# Patient Record
Sex: Female | Born: 1945
Health system: Southern US, Community
[De-identification: ages and names within clinical notes are randomized; demographics above are authoritative.]

## PROBLEM LIST (undated history)

## (undated) DIAGNOSIS — G43909 Migraine, unspecified, not intractable, without status migrainosus: Secondary | ICD-10-CM

## (undated) DIAGNOSIS — D219 Benign neoplasm of connective and other soft tissue, unspecified: Secondary | ICD-10-CM

## (undated) DIAGNOSIS — N952 Postmenopausal atrophic vaginitis: Secondary | ICD-10-CM

## (undated) DIAGNOSIS — I10 Essential (primary) hypertension: Secondary | ICD-10-CM

## (undated) DIAGNOSIS — N200 Calculus of kidney: Secondary | ICD-10-CM

## (undated) DIAGNOSIS — E7800 Pure hypercholesterolemia, unspecified: Secondary | ICD-10-CM

## (undated) DIAGNOSIS — I341 Nonrheumatic mitral (valve) prolapse: Secondary | ICD-10-CM

## (undated) DIAGNOSIS — M85859 Other specified disorders of bone density and structure, unspecified thigh: Secondary | ICD-10-CM

## (undated) DIAGNOSIS — IMO0002 Reserved for concepts with insufficient information to code with codable children: Secondary | ICD-10-CM

## (undated) DIAGNOSIS — R319 Hematuria, unspecified: Secondary | ICD-10-CM

## (undated) HISTORY — DX: Pure hypercholesterolemia, unspecified: E78.00

## (undated) HISTORY — DX: Other specified disorders of bone density and structure, unspecified thigh: M85.859

## (undated) HISTORY — DX: Hematuria, unspecified: R31.9

## (undated) HISTORY — DX: Migraine, unspecified, not intractable, without status migrainosus: G43.909

## (undated) HISTORY — DX: Benign neoplasm of connective and other soft tissue, unspecified: D21.9

## (undated) HISTORY — DX: Nonrheumatic mitral (valve) prolapse: I34.1

## (undated) HISTORY — DX: Essential (primary) hypertension: I10

## (undated) HISTORY — PX: HEMORRHOID BANDING: SHX5850

## (undated) HISTORY — DX: Calculus of kidney: N20.0

## (undated) HISTORY — DX: Postmenopausal atrophic vaginitis: N95.2

## (undated) HISTORY — DX: Reserved for concepts with insufficient information to code with codable children: IMO0002

## (undated) HISTORY — PX: DILATION AND CURETTAGE OF UTERUS: SHX78

---

## 1983-12-22 HISTORY — PX: TEMPOROMANDIBULAR JOINT SURGERY: SHX35

## 1984-04-20 HISTORY — PX: ABDOMINAL HYSTERECTOMY: SHX81

## 2001-02-18 ENCOUNTER — Other Ambulatory Visit: Admission: RE | Admit: 2001-02-18 | Discharge: 2001-02-18 | Payer: Self-pay | Admitting: *Deleted

## 2004-11-19 ENCOUNTER — Encounter: Admission: RE | Admit: 2004-11-19 | Discharge: 2004-11-19 | Payer: Self-pay | Admitting: Family Medicine

## 2005-11-04 ENCOUNTER — Encounter: Admission: RE | Admit: 2005-11-04 | Discharge: 2005-11-04 | Payer: Self-pay | Admitting: Family Medicine

## 2005-11-05 ENCOUNTER — Encounter: Admission: RE | Admit: 2005-11-05 | Discharge: 2005-11-05 | Payer: Self-pay | Admitting: Family Medicine

## 2005-12-23 ENCOUNTER — Ambulatory Visit (HOSPITAL_BASED_OUTPATIENT_CLINIC_OR_DEPARTMENT_OTHER): Admission: RE | Admit: 2005-12-23 | Discharge: 2005-12-23 | Payer: Self-pay | Admitting: Urology

## 2006-03-19 ENCOUNTER — Encounter: Admission: RE | Admit: 2006-03-19 | Discharge: 2006-03-19 | Payer: Self-pay | Admitting: Family Medicine

## 2007-03-21 ENCOUNTER — Encounter: Admission: RE | Admit: 2007-03-21 | Discharge: 2007-03-21 | Payer: Self-pay | Admitting: Family Medicine

## 2008-03-21 ENCOUNTER — Encounter: Admission: RE | Admit: 2008-03-21 | Discharge: 2008-03-21 | Payer: Self-pay | Admitting: Obstetrics and Gynecology

## 2008-10-03 ENCOUNTER — Encounter: Admission: RE | Admit: 2008-10-03 | Discharge: 2008-10-03 | Payer: Self-pay | Admitting: Family Medicine

## 2008-12-21 HISTORY — PX: ANKLE SURGERY: SHX546

## 2009-03-25 ENCOUNTER — Encounter: Admission: RE | Admit: 2009-03-25 | Discharge: 2009-03-25 | Payer: Self-pay | Admitting: Obstetrics and Gynecology

## 2009-04-23 ENCOUNTER — Ambulatory Visit: Payer: Self-pay | Admitting: Genetic Counselor

## 2009-07-11 ENCOUNTER — Encounter: Admission: RE | Admit: 2009-07-11 | Discharge: 2009-07-11 | Payer: Self-pay | Admitting: Family Medicine

## 2009-12-02 ENCOUNTER — Inpatient Hospital Stay (HOSPITAL_COMMUNITY): Admission: EM | Admit: 2009-12-02 | Discharge: 2009-12-04 | Payer: Self-pay | Admitting: Emergency Medicine

## 2010-03-27 ENCOUNTER — Encounter: Admission: RE | Admit: 2010-03-27 | Discharge: 2010-03-27 | Payer: Self-pay | Admitting: Family Medicine

## 2010-05-09 ENCOUNTER — Encounter: Admission: RE | Admit: 2010-05-09 | Discharge: 2010-05-09 | Payer: Self-pay | Admitting: Obstetrics and Gynecology

## 2010-12-12 IMAGING — CR DG ANKLE PORT 2V*L*
2 series · 2 of 2 positions shown · non-contrast
Comparison: Earlier films, same date.

CLINICAL DATA: Fracture dislocation.  Status post reduction#2.

PORTABLE LEFT ANKLE - 2 VIEW

[view not recorded (1 of 2)]
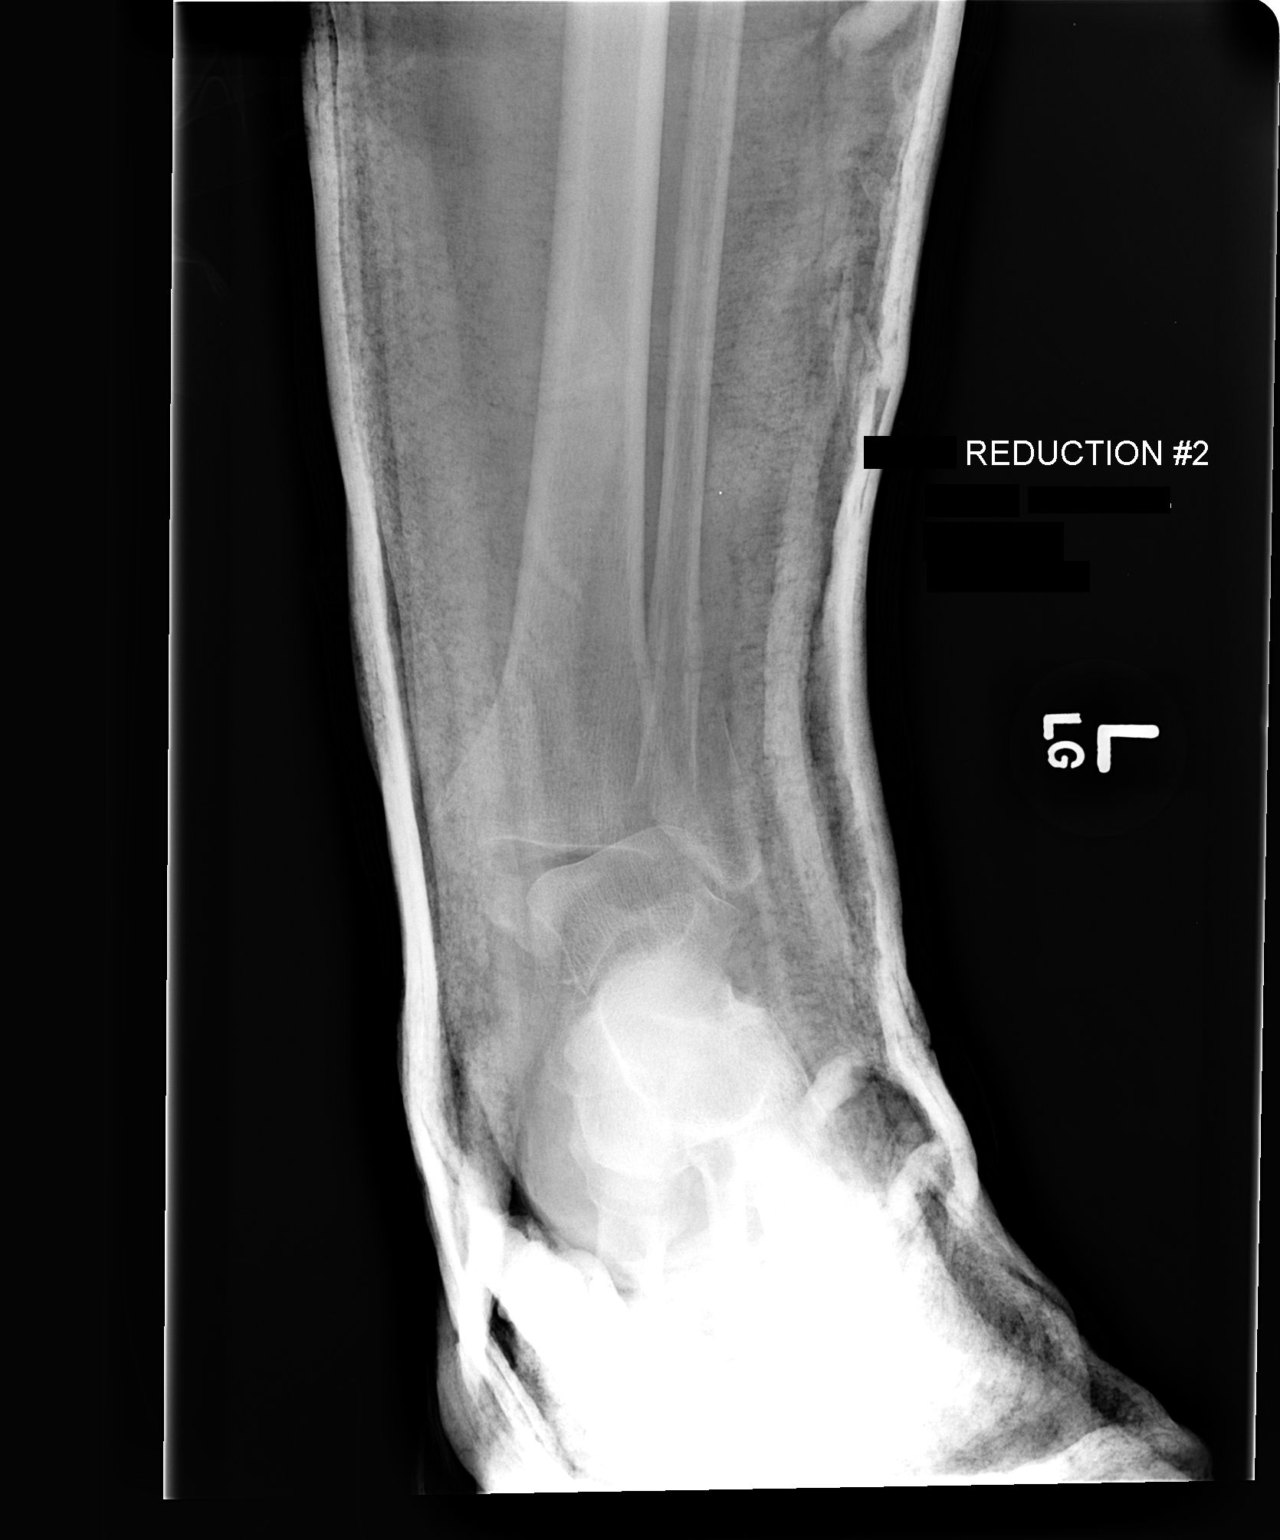

[view not recorded (2 of 2)]
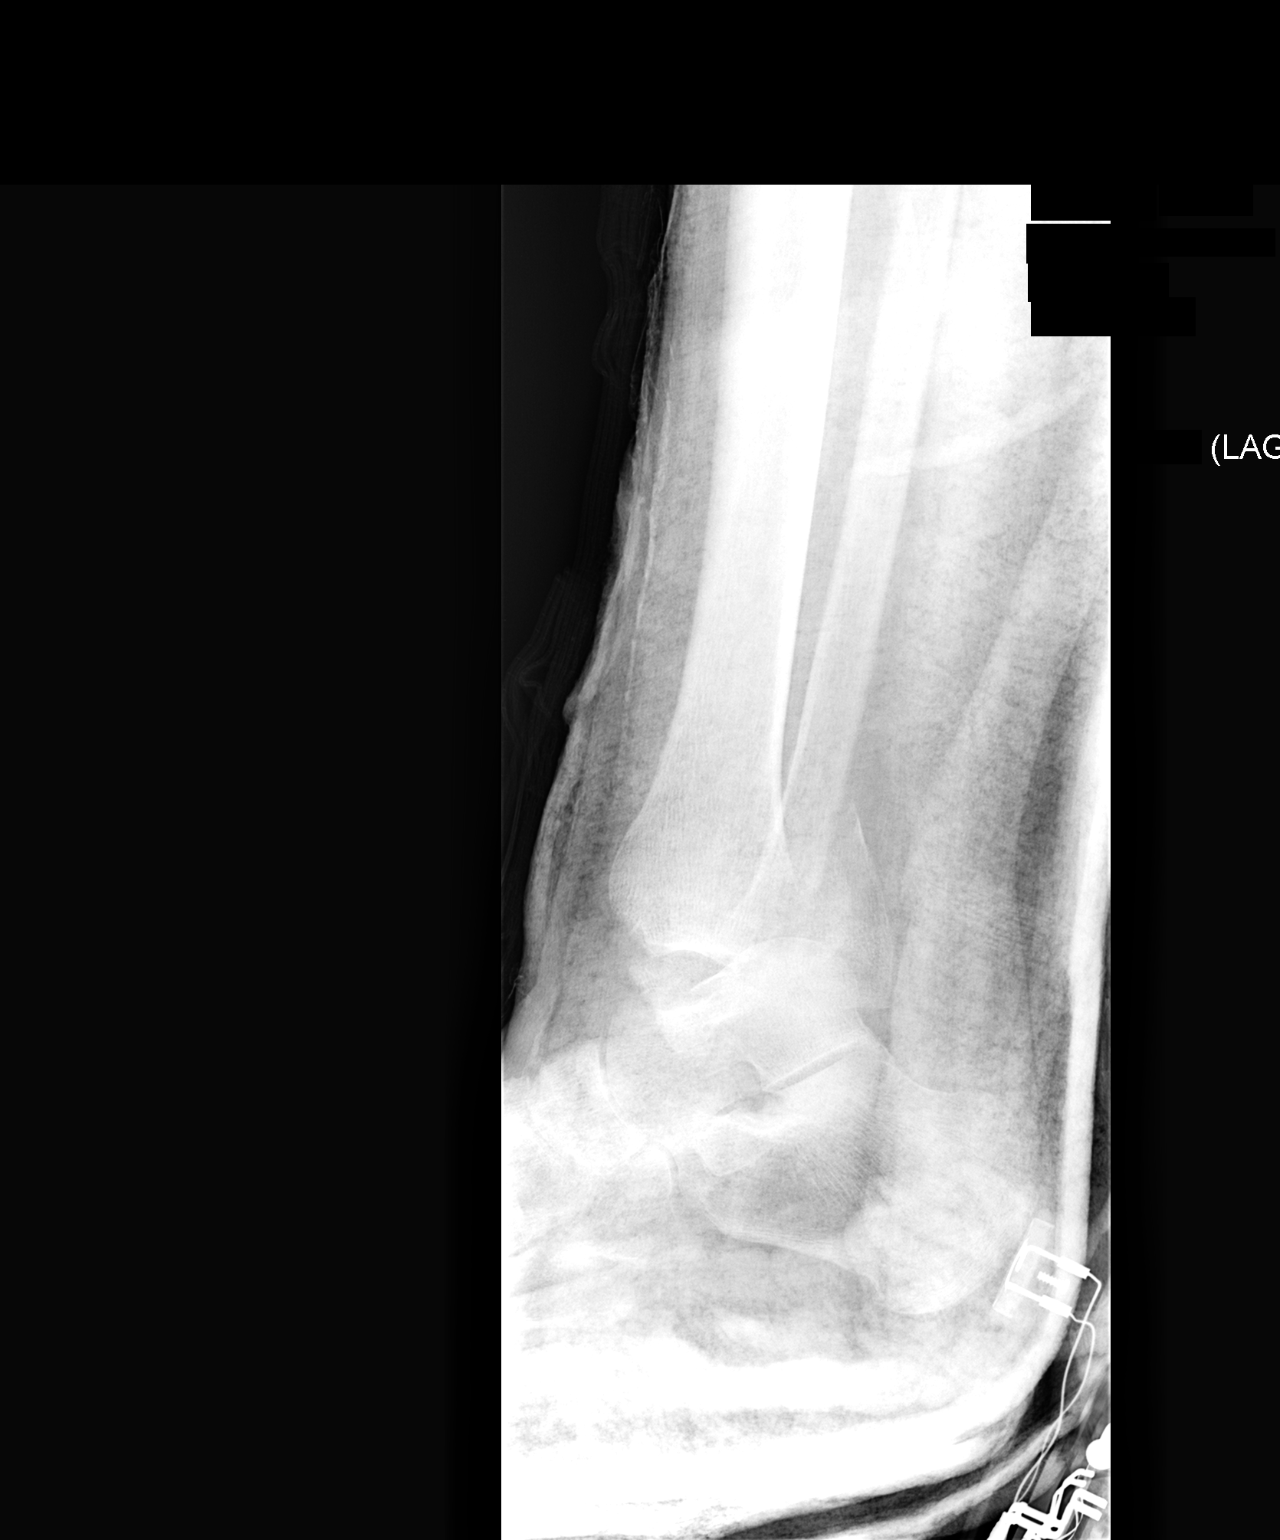

[2 of 2 positions shown; findings below may reference images not displayed]

FINDINGS: Persistent fracture dislocation of the ankle.  No change
in position or alignment.
IMPRESSION: Persistent fracture/dislocation.

## 2010-12-12 IMAGING — RF DG ANKLE COMPLETE 3+V*L*
1 series · 5 of 5 positions shown · non-contrast
Comparison: 12/02/2009

CLINICAL DATA: Ankle fracture - dislocation.  ORIF.

LEFT ANKLE COMPLETE - 3+ VIEW

[Series 1: run · 5 of 5 slices shown]
[im 1/5]
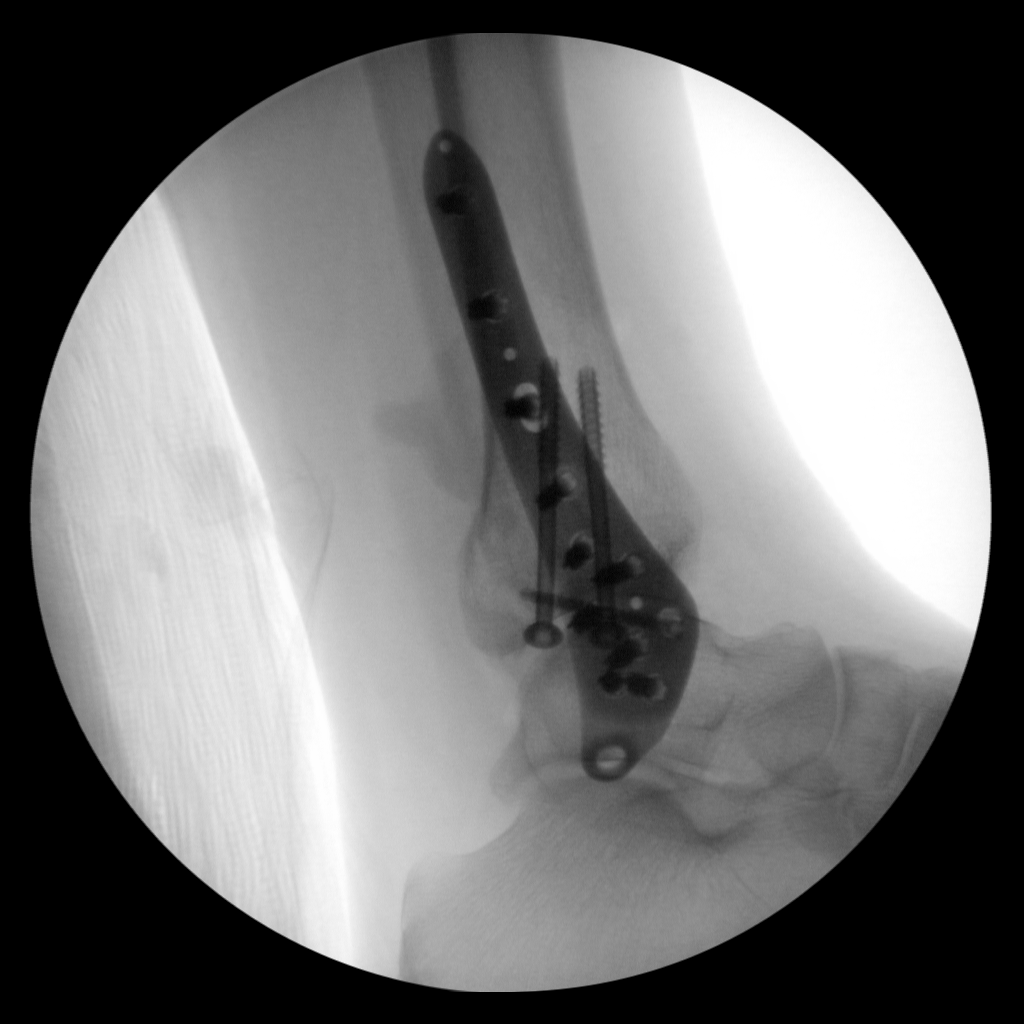
[im 2/5]
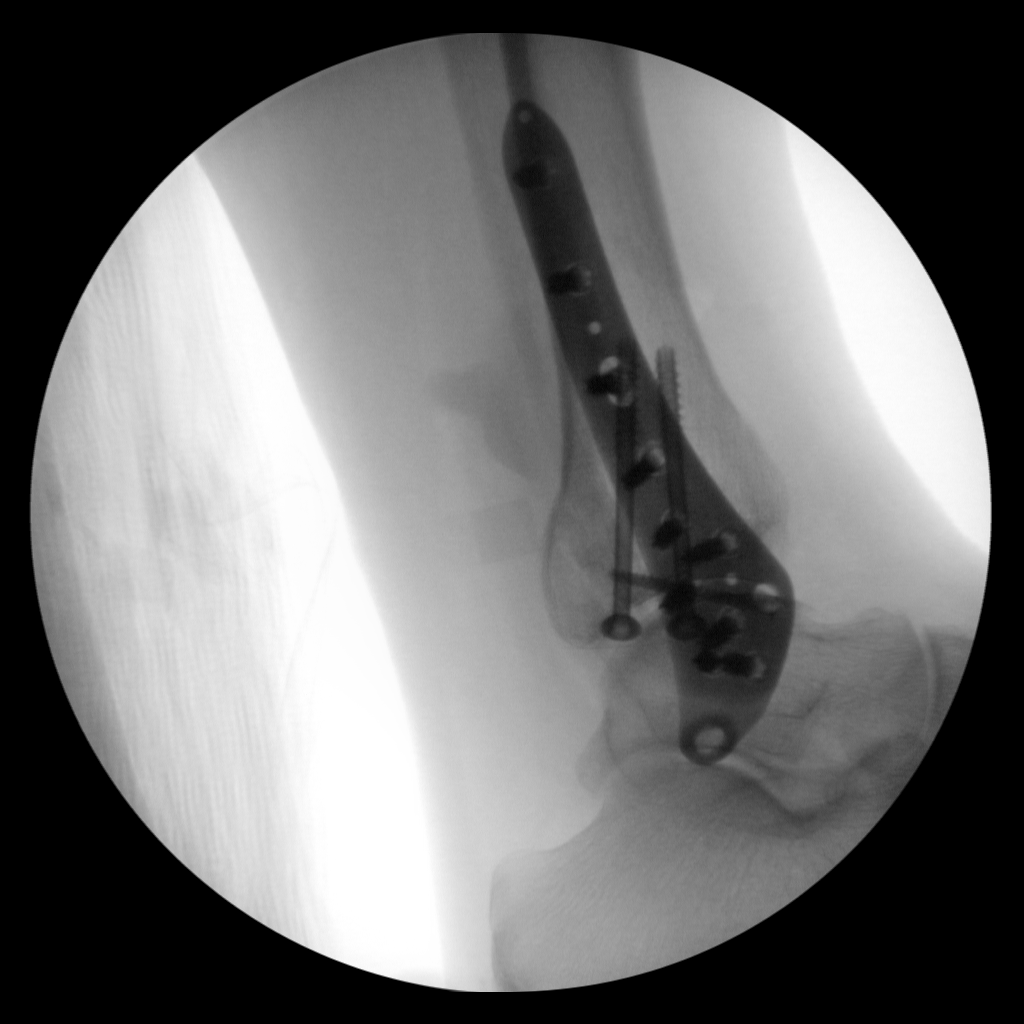
[im 3/5]
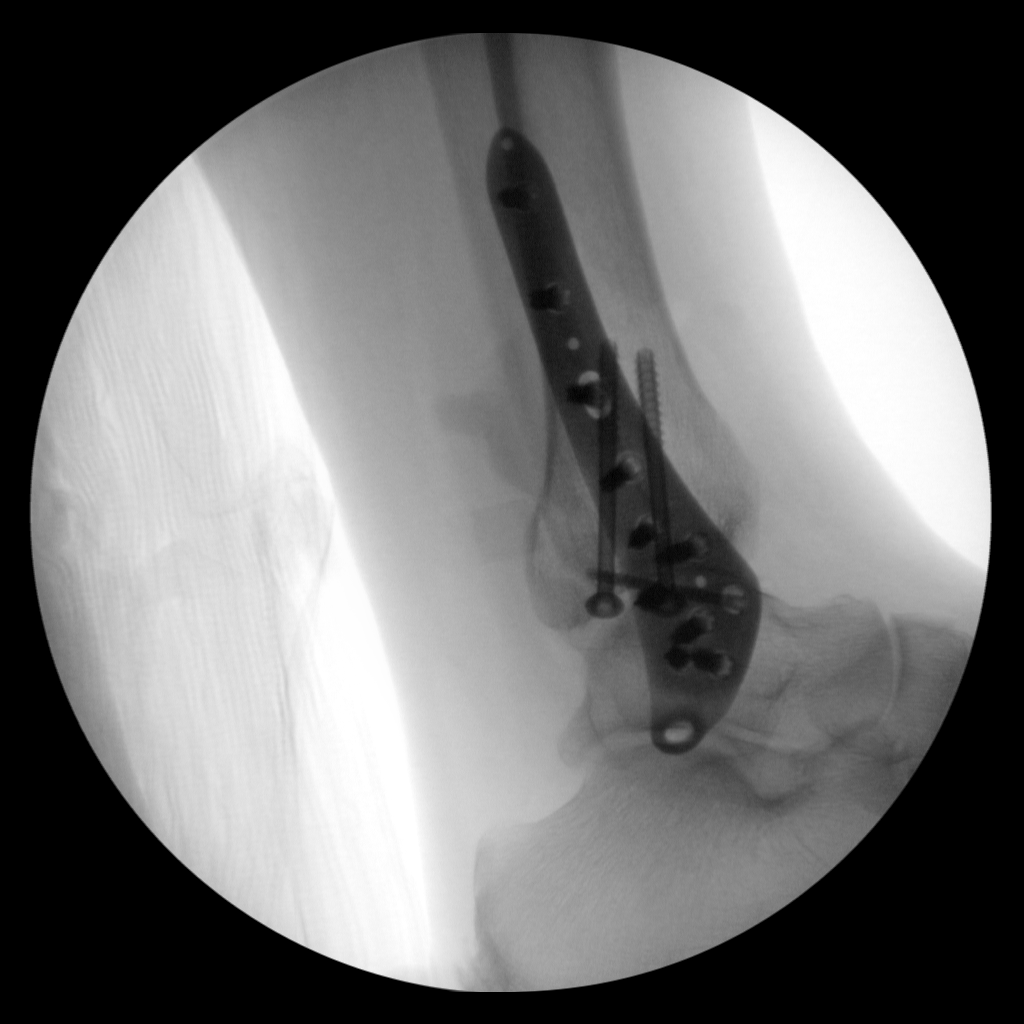
[im 4/5]
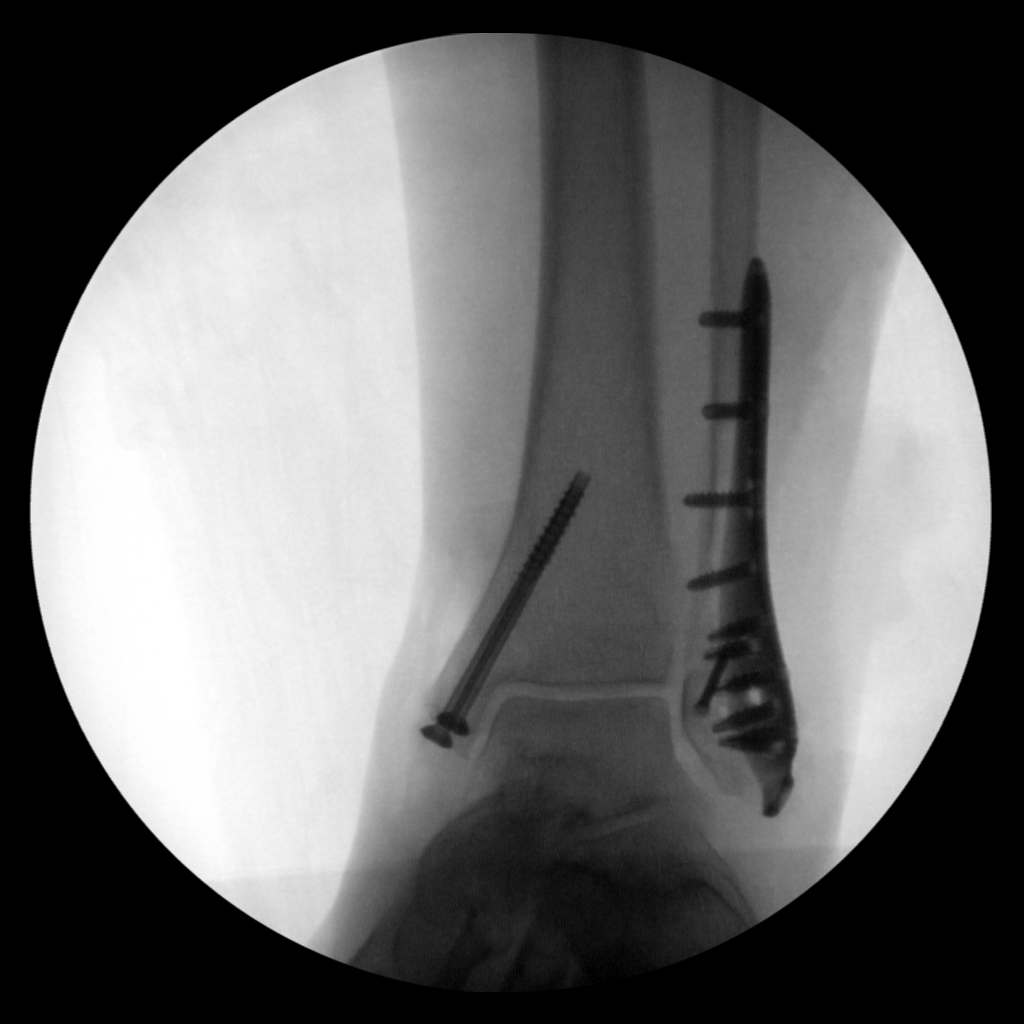
[im 5/5]
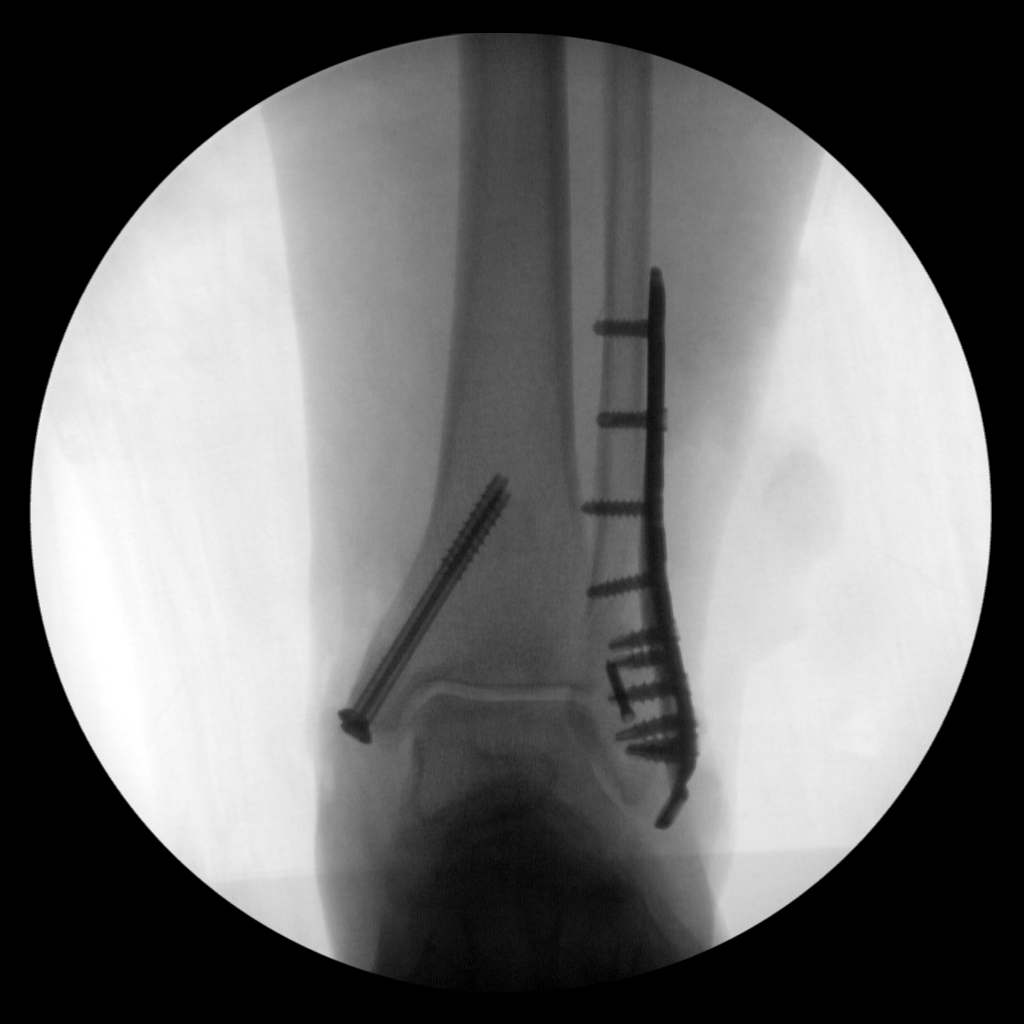

[5 of 5 positions shown; findings below may reference images not displayed]

FINDINGS: Left ankle intraoperative spot films are submitted
postoperatively for interpretation.

Internal plate screw fixation of the distal fibula and nail
fixation of the medial malleolus identified with the fracture sites
in near anatomic alignment and position.
There has been interval relocation of the tibiotalar joint.
IMPRESSION: Internal fixation of trimalleolar fractures, in near anatomic
alignment and position.

Interval relocation of ankle joint.

## 2010-12-12 IMAGING — CR DG ANKLE PORT 2V*L*
2 series · 2 of 2 positions shown · non-contrast
Comparison: Multiple prior studies.

CLINICAL DATA: Fracture dislocation. Postreduction #3.

PORTABLE LEFT ANKLE - 2 VIEW

[view not recorded (1 of 2)]
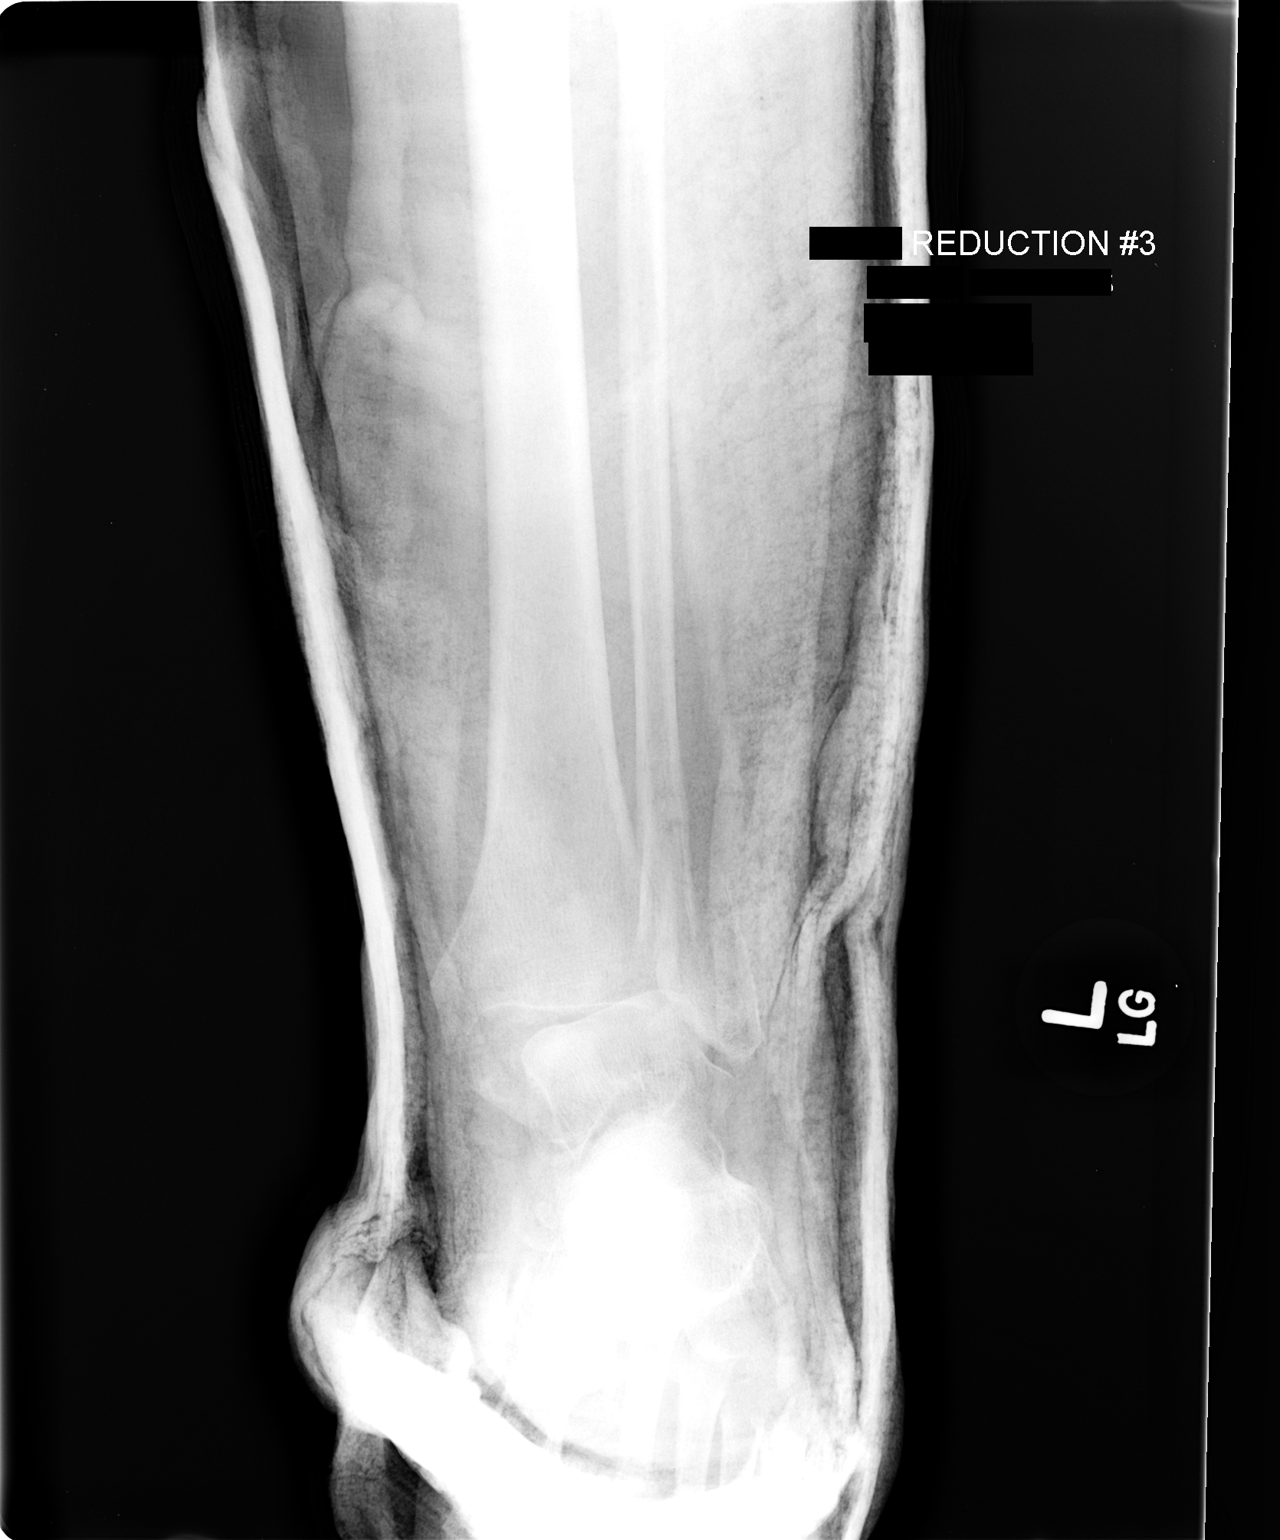

[view not recorded (2 of 2)]
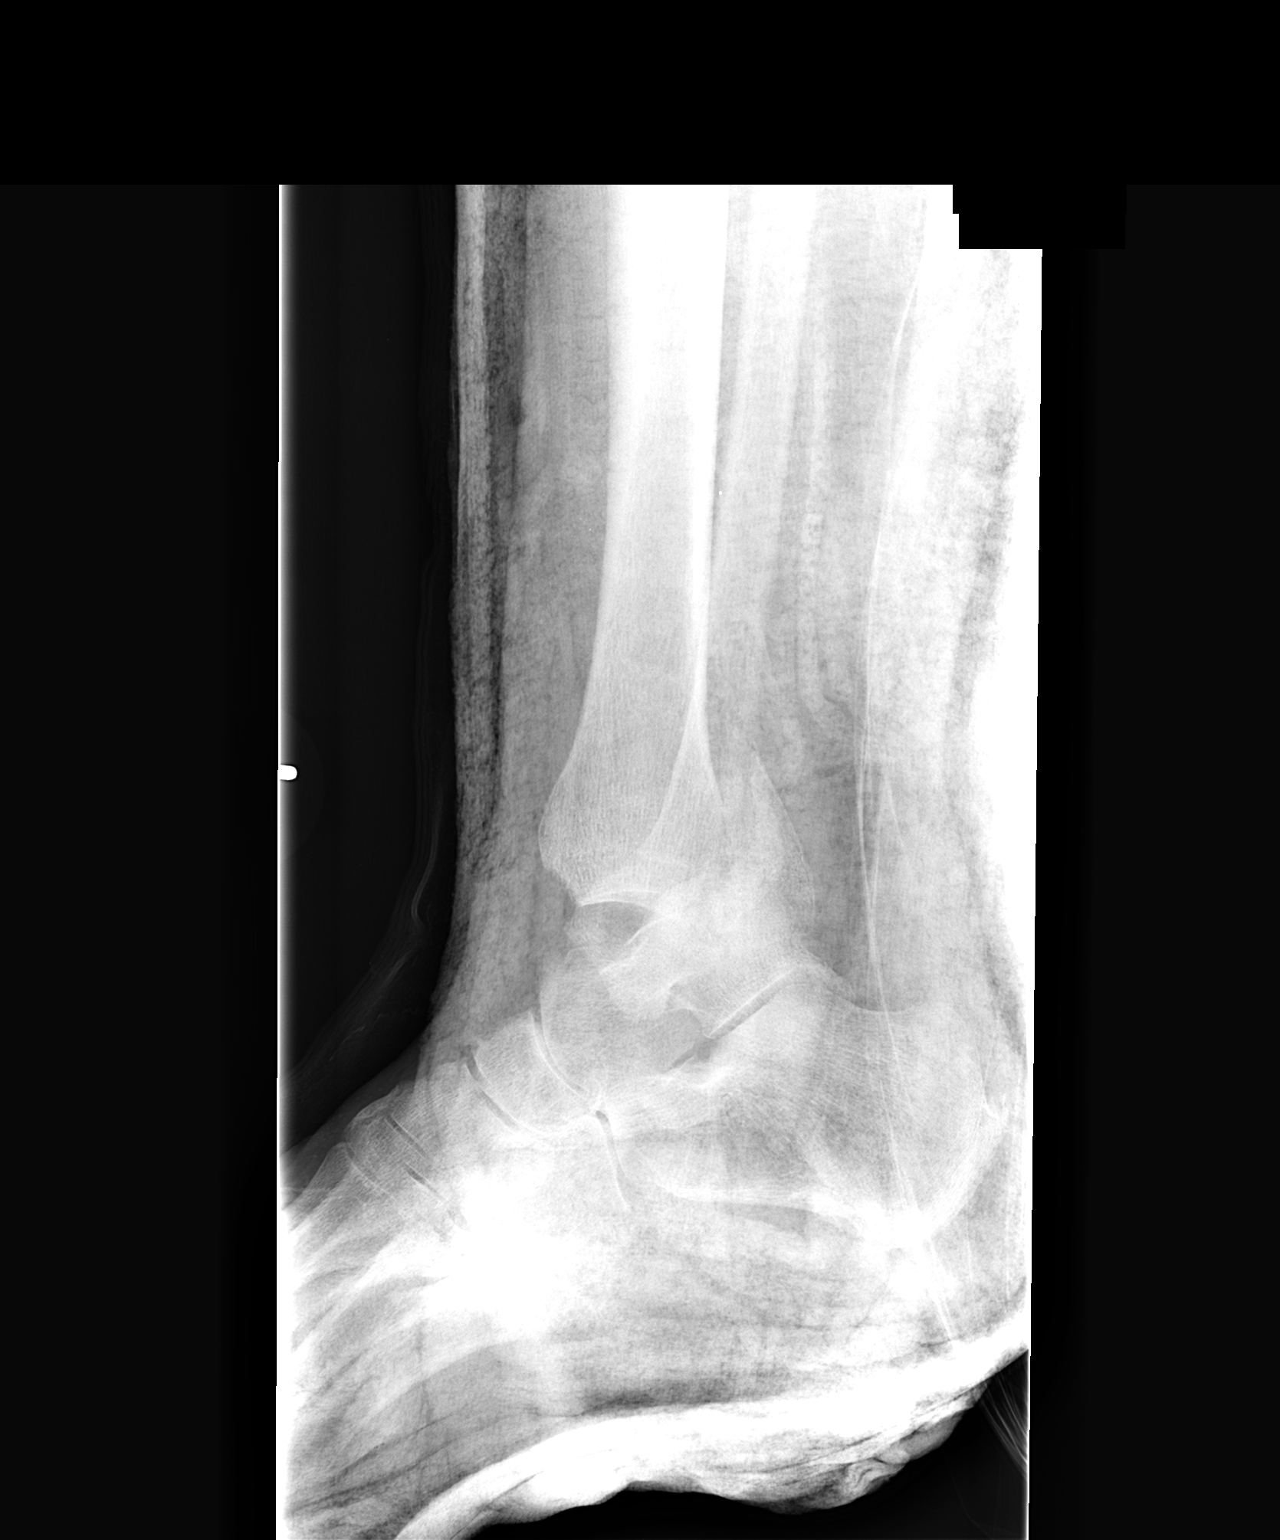

[2 of 2 positions shown; findings below may reference images not displayed]

FINDINGS: Persistent fracture dislocation of the ankle post
reduction #3.
IMPRESSION: Persistent fracture dislocation.

## 2011-03-12 ENCOUNTER — Other Ambulatory Visit: Payer: Self-pay | Admitting: Family Medicine

## 2011-03-12 DIAGNOSIS — Z1231 Encounter for screening mammogram for malignant neoplasm of breast: Secondary | ICD-10-CM

## 2011-03-24 LAB — CBC
HCT: 39.3 % (ref 36.0–46.0)
Hemoglobin: 13.3 g/dL (ref 12.0–15.0)
MCHC: 33.9 g/dL (ref 30.0–36.0)
MCV: 98.7 fL (ref 78.0–100.0)
Platelets: 196 10*3/uL (ref 150–400)
RBC: 3.98 MIL/uL (ref 3.87–5.11)
RDW: 12.1 % (ref 11.5–15.5)
WBC: 10.1 10*3/uL (ref 4.0–10.5)

## 2011-03-24 LAB — BASIC METABOLIC PANEL
BUN: 13 mg/dL (ref 6–23)
CO2: 26 mEq/L (ref 19–32)
Calcium: 8.2 mg/dL — ABNORMAL LOW (ref 8.4–10.5)
Chloride: 109 mEq/L (ref 96–112)
Creatinine, Ser: 0.66 mg/dL (ref 0.4–1.2)
GFR calc Af Amer: >60 mL/min (ref >60)
GFR calc non Af Amer: >60 mL/min (ref >60)
Glucose, Bld: 118 mg/dL — ABNORMAL HIGH (ref 70–99)
Potassium: 3.7 mEq/L (ref 3.5–5.1)
Sodium: 140 mEq/L (ref 135–145)

## 2011-03-24 LAB — PROTIME-INR
INR: 1 (ref 0.00–1.49)
Prothrombin Time: 13.1 seconds (ref 11.6–15.2)

## 2011-03-30 ENCOUNTER — Ambulatory Visit
Admission: RE | Admit: 2011-03-30 | Discharge: 2011-03-30 | Disposition: A | Discharge status: Home / Self Care | Payer: BLUE CROSS/BLUE SHIELD | Source: Ambulatory Visit | Attending: Family Medicine | Admitting: Family Medicine

## 2011-03-30 DIAGNOSIS — Z1231 Encounter for screening mammogram for malignant neoplasm of breast: Secondary | ICD-10-CM

## 2011-05-08 NOTE — Op Note (Signed)
Rose Garza, Rose Garza               ACCOUNT NO.:  [id number]   MEDICAL RECORD NO.:  [id number]          PATIENT TYPE:  AMB   LOCATION:  [facility]                         FACILITY:  [facility]   PHYSICIAN:  Maretta Bees. Vonita Moss, M.D.DATE OF BIRTH:  08-24-1946   DATE OF PROCEDURE:  12/23/2005  DATE OF DISCHARGE:                                 OPERATIVE REPORT   PREOPERATIVE DIAGNOSIS:  Right upper ureteral/ureteropelvic junction stone.   POSTOPERATIVE DIAGNOSIS:  Right upper ureteral/ureteropelvic junction stone.   PROCEDURE:  Cystoscopy, right ureteroscopy, holmium laser fragmentation of  stone, right retrograde pyelogram with interpretation and insertion of right  double-J catheter.   SURGEON:  Dr. Larey Dresser.   ANESTHESIA:  General.   INDICATIONS:  This lady has had some intermittent right flank and discomfort  since the early part of November and a CT scan then showed a 5 mm stone at  the right UPJ. She has been followed by me since and then and has had no  fevers and no evidence of infection and some intermittent discomfort. The  stone was some seen easily or not depending upon whether she had any  overlying stool or gas. Because of the high location of the stone and the  fragile looking appearance of it, I was planning to go ahead with ESL but  since the stone was not readily visible on all films, I thought it best that  an endoscopic approach be undertaken. This was precipitated by a CT scan  yesterday that showed some persistent hydronephrosis and upper ureteral  edema and I felt the stone was impacted and unlikely to pass. She was  advised about ureteroscopy, ureteral injury, bleeding and double-J catheter  placement.   DESCRIPTION OF PROCEDURE:  The patient was brought to the operating room,  placed in lithotomy position, external genitalia were prepped, draped in the  usual fashion. She was cystoscoped and the bladder was unremarkable. A  guidewire was placed up the right  ureter and it actually went by the stone  quite easily with the softened tip. I then used the internal sheath of a  ureteral access sheath and dilated the distal ureter. I then used the 6-  [place] rigid ureteroscope which easily traversed the lower ureter but in  following the guidewire, once I got to about a vertical body below the  stone, the scope would not advance easily because of angulation and  therefore I took the ureteroscope out and reinserted the ureteral access  sheath with the outside sheath on it and with that in place I used the  ultrathin flexible ureteroscope and followed the guidewire up toward the  stone which appeared impacted but also very fragile and irregular. I was  about to secure this stone with a basket when it washed back up in the  kidney and I followed the stone by maneuvering the scope through the UPJ and  found this stone in the upper pole. I then inserted the holmium laser and  fortunately, I was able to reveal localize the stone and was able to apply  several shocks of laser to it  and it appeared to fragment quite easily.  After that, I could not relocate any significant piece of stone, did see a  couple of small fragments indicative of partial if not total fragmentation  of the stone.   Right retrograde pyelogram showed some pyelocaliectasis but no  extravasation. At this point, I decided to insert a double-J catheter and  measured a 26 cm ureteral length and inserted a 6-French 26 cm Polaris stent  with good proximal coil in the renal pelvis and distally about a centimeter  of stent protruded out the orifice and then the loops  at the end of the stent were left in the bladder, having previously removed  the string attached to them. The bladder was emptied, scope removed and the  patient sent to the recovery room in good condition having tolerated the  procedure well.      Maretta Bees. Vonita Moss, M.D.  Electronically Signed     LJP/MEDQ  D:   12/23/2005  T:  12/23/2005  Job:  [id number]   cc:   Donia Guiles, M.D.  Fax: [phone]

## 2012-04-04 ENCOUNTER — Other Ambulatory Visit: Payer: Self-pay | Admitting: Family Medicine

## 2012-04-04 DIAGNOSIS — Z1231 Encounter for screening mammogram for malignant neoplasm of breast: Secondary | ICD-10-CM

## 2012-05-02 ENCOUNTER — Ambulatory Visit
Admission: RE | Admit: 2012-05-02 | Discharge: 2012-05-02 | Disposition: A | Discharge status: Home / Self Care | Payer: BC Managed Care – PPO | Source: Ambulatory Visit | Attending: Family Medicine | Admitting: Family Medicine

## 2012-05-02 DIAGNOSIS — Z1231 Encounter for screening mammogram for malignant neoplasm of breast: Secondary | ICD-10-CM

## 2012-05-04 ENCOUNTER — Other Ambulatory Visit: Payer: Self-pay | Admitting: Family Medicine

## 2012-05-04 DIAGNOSIS — R928 Other abnormal and inconclusive findings on diagnostic imaging of breast: Secondary | ICD-10-CM

## 2012-05-10 ENCOUNTER — Ambulatory Visit
Admission: RE | Admit: 2012-05-10 | Discharge: 2012-05-10 | Disposition: A | Discharge status: Home / Self Care | Payer: BC Managed Care – PPO | Source: Ambulatory Visit | Attending: Family Medicine | Admitting: Family Medicine

## 2012-05-10 DIAGNOSIS — R928 Other abnormal and inconclusive findings on diagnostic imaging of breast: Secondary | ICD-10-CM

## 2012-11-07 ENCOUNTER — Other Ambulatory Visit: Payer: Self-pay | Admitting: Obstetrics and Gynecology

## 2012-11-07 DIAGNOSIS — M8580 Other specified disorders of bone density and structure, unspecified site: Secondary | ICD-10-CM

## 2012-11-28 ENCOUNTER — Other Ambulatory Visit: Payer: Self-pay | Admitting: Family Medicine

## 2012-11-28 ENCOUNTER — Ambulatory Visit
Admission: RE | Admit: 2012-11-28 | Discharge: 2012-11-28 | Disposition: A | Discharge status: Home / Self Care | Payer: BC Managed Care – PPO | Source: Ambulatory Visit | Attending: Obstetrics and Gynecology | Admitting: Obstetrics and Gynecology

## 2012-11-28 DIAGNOSIS — M8580 Other specified disorders of bone density and structure, unspecified site: Secondary | ICD-10-CM

## 2013-04-24 ENCOUNTER — Other Ambulatory Visit: Payer: Self-pay

## 2013-04-24 DIAGNOSIS — Z1231 Encounter for screening mammogram for malignant neoplasm of breast: Secondary | ICD-10-CM

## 2013-05-09 ENCOUNTER — Encounter: Payer: Self-pay | Admitting: Certified Nurse Midwife

## 2013-05-10 ENCOUNTER — Ambulatory Visit (INDEPENDENT_AMBULATORY_CARE_PROVIDER_SITE_OTHER): Payer: BC Managed Care – PPO | Admitting: Certified Nurse Midwife

## 2013-05-10 ENCOUNTER — Encounter: Payer: Self-pay | Admitting: Certified Nurse Midwife

## 2013-05-10 VITALS — BP 110/64 | Ht 62.25 in | Wt 154.0 lb

## 2013-05-10 DIAGNOSIS — Z0000 Encounter for general adult medical examination without abnormal findings: Secondary | ICD-10-CM

## 2013-05-10 DIAGNOSIS — Z01419 Encounter for gynecological examination (general) (routine) without abnormal findings: Secondary | ICD-10-CM

## 2013-05-10 NOTE — Progress Notes (Addendum)
67 y.o. G44P2002 Married Caucasian Female here for annual exam. Menopausal  No HRT. Denies vaginal bleeding.  Using OTC lubricant for dryness with good result Saw PCP two days ago for aex and labs.  All normal per patient.  Hypertension and hypothyroid stable on medication. No health issues today.  Patient's last menstrual period was 12/21/1986.          Sexually active: yes  The current method of family planning is post menopausal status.    Exercising: yes  Home exercise routine includes stretching and yoga. Smoker:  no  Health Maintenance: Pap:  02/18/01 History of abnormal Pap:  no MMG:  04/22/12, 05/10/12 negative Colonoscopy:04/2006 BMD:  12/09/12 TDaP:  2012 Screening Labs: none   reports that she has never smoked. She does not have any smokeless tobacco history on file. She reports that she does not drink alcohol or use illicit drugs.  Past Medical History  Diagnosis Date  . Hypertension   . MVP (mitral valve prolapse)   . Migraines   . Dyspareunia   . Atrophic vaginitis   . Hematuria     negative workup  . Hypercholesteremia   . Kidney stones   . Fibroid     Past Surgical History  Procedure Laterality Date  . Temporomandibular joint surgery  1985  . Abdominal hysterectomy      1985  . Dilation and curettage of uterus    . Ankle surgery Left 2010  . Cesarean section      times 2    Current Outpatient Prescriptions  Medication Sig Dispense Refill  . alendronate (FOSAMAX) 70 MG tablet Take 70 mg by mouth every 7 (seven) days. Take with a full glass of water on an empty stomach.      Marland Kitchen atorvastatin (LIPITOR) 10 MG tablet Take 10 mg by mouth daily.      Marland Kitchen CALCIUM PO Take by mouth daily.      . Cholecalciferol (VITAMIN D PO) Take 20,001 Int'l Units by mouth daily.      . citalopram (CELEXA) 20 MG tablet Take 20 mg by mouth daily.      Marland Kitchen levothyroxine (SYNTHROID, LEVOTHROID) 112 MCG tablet Take 112 mcg by mouth daily before breakfast.      . raloxifene (EVISTA) 60 MG  tablet Take 60 mg by mouth daily.      . verapamil (VERELAN PM) 240 MG 24 hr capsule Take 240 mg by mouth at bedtime.       No current facility-administered medications for this visit.    Family History  Problem Relation Age of Onset  . Breast cancer Mother   . Heart disease Mother   . Osteoporosis Mother   . Breast cancer Maternal Aunt   . Breast cancer Maternal Aunt   . Breast cancer Maternal Aunt     ROS:  Pertinent items are noted in HPI.  Otherwise, a comprehensive ROS was negative.  Exam:   BP 110/64  Ht 5' 2.25" (1.581 m)  Wt 154 lb (69.854 kg)  BMI 27.95 kg/m2  LMP 12/21/1986  Weight change: @WEIGHTCHANGE @ Height:   Height: 5' 2.25" (158.1 cm)  Ht Readings from Last 3 Encounters:  05/10/13 5' 2.25" (1.581 m)    General appearance: alert, cooperative and appears stated age Head: Normocephalic, without obvious abnormality, atraumatic Neck: no adenopathy, supple, symmetrical, trachea midline and thyroid normal to inspection and palpation Lungs: clear to auscultation bilaterally Breasts: normal appearance, no masses or tenderness, No nipple retraction or dimpling, No nipple  discharge or bleeding, No axillary or supraclavicular adenopathy Heart: regular rate and rhythm Abdomen: soft, non-tender; bowel sounds normal; no masses,  no organomegaly Extremities: extremities normal, atraumatic, no cyanosis or edema Skin: Skin color, texture, turgor normal. No rashes or lesions Lymph nodes: Cervical, supraclavicular, and axillary nodes normal. No abnormal inguinal nodes palpated Neurologic: Grossly normal   Pelvic: External genitalia: atrophic appearance,  no lesions              Urethra:  normal appearing urethra with no masses, tenderness or lesions              Bartholins and Skenes: normal                 Vagina:atrophicappearing vagina with normal color and discharge, no lesions              Cervix: absent              Pap taken: no Bimanual Exam:  Uterus:  uterus  absent              Adnexa: normal adnexa and no mass, fullness, tenderness               Rectovaginal: Confirms               Anus:  normal sphincter tone, no lesions  A:  Well Woman with normal exam  Menopausal, no HRT, s/p TAH ovaries retained fibroid history  Atrophic vaginitis  Hypertension, Hypothyroid stable medication with PCP  Family history of Breast Cancer(mother, 3 ma no genetic testing) Patient tested BrCa negative   P:  Reviewed health and wellness pertinent to exam  Mammogram yearly with diagnostic as recommended., Stressed SBE. pap smear per guidelines, not taken today  Encouraged moisturizer use vaginally instead of lubricate for better coverage for dryness  Continue follow up as indicated counseled on mammography screening, feminine hygiene, adequate intake of calcium and vitamin D, diet and exercise IFOB dispensed return annually or prn  An After Visit Summary was printed and given to the patient.  Reviewed, TL

## 2013-05-10 NOTE — Patient Instructions (Addendum)

## 2013-05-23 ENCOUNTER — Ambulatory Visit
Admission: RE | Admit: 2013-05-23 | Discharge: 2013-05-23 | Disposition: A | Discharge status: Home / Self Care | Payer: BC Managed Care – PPO | Source: Ambulatory Visit

## 2013-05-23 ENCOUNTER — Ambulatory Visit: Payer: BC Managed Care – PPO

## 2013-05-23 DIAGNOSIS — Z1231 Encounter for screening mammogram for malignant neoplasm of breast: Secondary | ICD-10-CM

## 2013-05-24 ENCOUNTER — Other Ambulatory Visit: Payer: Self-pay | Admitting: Family Medicine

## 2013-05-24 DIAGNOSIS — R928 Other abnormal and inconclusive findings on diagnostic imaging of breast: Secondary | ICD-10-CM

## 2013-06-19 ENCOUNTER — Ambulatory Visit
Admission: RE | Admit: 2013-06-19 | Discharge: 2013-06-19 | Disposition: A | Discharge status: Home / Self Care | Payer: BC Managed Care – PPO | Source: Ambulatory Visit | Attending: Family Medicine | Admitting: Family Medicine

## 2013-06-19 DIAGNOSIS — R928 Other abnormal and inconclusive findings on diagnostic imaging of breast: Secondary | ICD-10-CM

## 2013-06-21 ENCOUNTER — Telehealth: Payer: Self-pay | Admitting: Certified Nurse Midwife

## 2013-06-22 ENCOUNTER — Ambulatory Visit (INDEPENDENT_AMBULATORY_CARE_PROVIDER_SITE_OTHER): Payer: BC Managed Care – PPO | Admitting: Certified Nurse Midwife

## 2013-06-22 VITALS — BP 124/62 | Temp 98.2°F | Resp 14 | Wt 151.0 lb

## 2013-06-22 DIAGNOSIS — N952 Postmenopausal atrophic vaginitis: Secondary | ICD-10-CM

## 2013-06-22 DIAGNOSIS — R102 Pelvic and perineal pain: Secondary | ICD-10-CM

## 2013-06-22 DIAGNOSIS — B373 Candidiasis of vulva and vagina: Secondary | ICD-10-CM

## 2013-06-22 DIAGNOSIS — N390 Urinary tract infection, site not specified: Secondary | ICD-10-CM

## 2013-06-22 DIAGNOSIS — R319 Hematuria, unspecified: Secondary | ICD-10-CM

## 2013-06-22 DIAGNOSIS — N949 Unspecified condition associated with female genital organs and menstrual cycle: Secondary | ICD-10-CM

## 2013-06-22 LAB — POCT URINALYSIS DIPSTICK
Blood, UA: 2
Urobilinogen, UA: NEGATIVE
pH, UA: 5

## 2013-06-22 MED ORDER — NITROFURANTOIN MONOHYD MACRO 100 MG PO CAPS: 100.0000 mg | ORAL_CAPSULE | Freq: Two times a day (BID) | ORAL | Status: DC

## 2013-06-22 MED ORDER — TERCONAZOLE 0.4 % VA CREA: 1.0000 | TOPICAL_CREAM | Freq: Every day | VAGINAL | Status: DC

## 2013-06-22 NOTE — Progress Notes (Signed)
S:  @66  y.o.Married Caucasian female presents with UTI with symptoms of urinary frequency, urinary urgency, pressure without relief for a total of 4 days. Onset of symptoms one week ago.. The patient is having no constitutional symptoms, denying fever, chills, anorexia, or weight loss..  Symptoms related to post coital Yes. Patient has been traveling and was seen by PCP for suspected UTI, treated with 3 day Cipro ended dose 5 days ago with no change in symptoms, came here because she was concerned it was a vaginal problem. Having some vaginal tenderness, but continued pressure to urinate and frequency with pain on urination. Using Replens OTC for vaginal moisture with good response.  ROS: fatigued  O: alert, oriented to person, place, and time   healthy, well developed and well nourished  Positive suprapubic tenderensess  Skin: warm and dry  Negative CVAT bilateral  Pelvic Exam: external genital area atrophic appearance, non tender  Urethra tender, Urethral meatus tender and red  Bladder tender  Vagina: white discharge, thick  Wet prep taken ph 4.5  Cervix,Uterus absent  Adnexa: Absent, no masses, non tender    POCT urine 2+ blood,2+ leukocytes Wet Prep: Positive for yeast, negative for BV    Assessment: UTI unresolved, post coital 2-Yeast vaginitis 3-Atrophic Vaginitis  Plan:Discussed findings of antibiotic may not have been sensitive for the bacteria she may have. Instructed to increase water po. Reviewed warning signs and symptoms of UTI and need to call if symptoms are resolving. Discussed need to empty bladder after sexual activity to reduce risk of reoccurrence.  May consider medication for post coital once cleared. Rx Macrobid see order Can obtain AZO cranberry OTC to use for comfort for instructions(patient choice, instead of RX) OZH:YQMVH micro, urine culture 2- Reviewed finding. Rx Diflucan see order 3-Continue Replens OTC as instructed for vaginal dryness, may consider change  if not effective, but has been workign for her.  RV 2 weeks, TOC and prn

## 2013-06-23 LAB — URINALYSIS, MICROSCOPIC ONLY
Bacteria, UA: NONE SEEN
Casts: NONE SEEN

## 2013-06-25 ENCOUNTER — Encounter: Payer: Self-pay | Admitting: Certified Nurse Midwife

## 2013-06-25 NOTE — Progress Notes (Signed)
Be sure to check this culture.  If it is negative, she will need uro referral.  A 67 yo with blood and wbc's without an infecton equals a tumor until proven otherwise.

## 2013-06-25 NOTE — Progress Notes (Signed)
Reviewed CPRomine, MD 

## 2013-06-26 ENCOUNTER — Telehealth: Payer: Self-pay

## 2013-06-26 NOTE — Telephone Encounter (Signed)
Patient notified of results.

## 2013-06-26 NOTE — Telephone Encounter (Signed)
Pt returning call

## 2013-06-26 NOTE — Telephone Encounter (Signed)
lmtcb

## 2013-06-26 NOTE — Telephone Encounter (Signed)
Message copied by Eliezer Bottom on Mon Jun 26, 2013  9:40 AM ------      Message from: Verner Chol      Created: Mon Jun 26, 2013  8:32 AM       Notify patient urine microscopic negative for bacteria, but positive for WBC(can be present with yeast)      And still slight RBC      Culture is low amount indicating UTI should be resolving      Patient status      Needs TOC appointment 2 weeks from date seen with urine micro ? culture ------

## 2013-07-06 ENCOUNTER — Ambulatory Visit (INDEPENDENT_AMBULATORY_CARE_PROVIDER_SITE_OTHER): Payer: BC Managed Care – PPO | Admitting: Certified Nurse Midwife

## 2013-07-06 VITALS — BP 104/64 | HR 60 | Resp 16 | Ht 62.25 in | Wt 151.0 lb

## 2013-07-06 DIAGNOSIS — N390 Urinary tract infection, site not specified: Secondary | ICD-10-CM

## 2013-07-06 LAB — POCT URINALYSIS DIPSTICK
Leukocytes, UA: NEGATIVE
Protein, UA: NEGATIVE
Urobilinogen, UA: NEGATIVE
pH, UA: 5

## 2013-07-06 NOTE — Progress Notes (Signed)
67 y.o. Married Caucasian female G2P2002 here for follow up of UTI treated with Macrobid initiated on July.3,2014 Completed all medication as directed.  Denies any symptoms of urinary urgency, frequency or pain. Passed kidney stone 3-4 days ago and pressure sensation resolved. Denies fever, chills, nausea or back pain now.  Feels so much better.  Urine culture: 8000, mixed bacteria, no specific bacteria on 06/22/13  O: Healthy WD,WN female Affect: normal, orientation x 3 Skin:warm and dry Negative CVAT bilateral Abdomen:negative suprapubic pain, non tender Pelvic exam:EXTERNAL GENITALIA: normal appearing vulva with no masses, tenderness or lesions Urethra/urethral meatus/ bladder: non tender VAGINA: no abnormal discharge or lesions CERVIX: no lesions or cervical motion tenderness POCT urine: negative  A: UTI Resolved  2-History of kidney stones 7 years ago, passed one 3-4 days ago.   P: Discussed findings of normal exam and urine. Discussed with history of kidney stones if symptoms reoccur may need to see urology if no infection present. Patient aware of warning signs and symptoms of UTI and need to be evaluated.   RV prn Reviewed, TL

## 2013-07-06 NOTE — Progress Notes (Deleted)
67 y.o.Married{Race/ethnicity:17218} female [id number] with a {NUMBERS 1-20:19198} {gen duration:315003} history of the following:{symptoms; vaginitis:30830} Sexually active: {yes no:314532} Last sexual activity:{NUMBERS 1-20:19198}days ago. Pt also reports the following associated symptoms: {Sx; associated vaginitis:30832} Patient {HAS HAS NOT:18834}tried over the counter treatment with {Relief:12621} relief.     Exam:  Ext:{EXAM; GYN JWJXB:14782}                Vag:{Findings; vagina (ob1):14593}                Cx:  {exam; gyn cervix:30847}                Uterus:{exam; uterus:14489}                Adnexa: {exam; adnexa:12223}  Wet Prep shows:{Findings; GYN salin prep:60700}  Poct urine-  Dx:{vaginitis type:315262}   Tx:{treatments; vaginitis:14231}

## 2013-07-07 ENCOUNTER — Other Ambulatory Visit: Payer: Self-pay | Admitting: Family Medicine

## 2013-07-07 DIAGNOSIS — R928 Other abnormal and inconclusive findings on diagnostic imaging of breast: Secondary | ICD-10-CM

## 2013-12-07 ENCOUNTER — Ambulatory Visit
Admission: RE | Admit: 2013-12-07 | Discharge: 2013-12-07 | Disposition: A | Discharge status: Home / Self Care | Payer: BC Managed Care – PPO | Source: Ambulatory Visit | Attending: Family Medicine | Admitting: Family Medicine

## 2013-12-07 DIAGNOSIS — R928 Other abnormal and inconclusive findings on diagnostic imaging of breast: Secondary | ICD-10-CM

## 2014-01-08 ENCOUNTER — Other Ambulatory Visit: Payer: Self-pay

## 2014-01-08 DIAGNOSIS — Z803 Family history of malignant neoplasm of breast: Secondary | ICD-10-CM

## 2014-01-08 DIAGNOSIS — Z1231 Encounter for screening mammogram for malignant neoplasm of breast: Secondary | ICD-10-CM

## 2014-01-10 ENCOUNTER — Encounter: Payer: Self-pay | Admitting: Certified Nurse Midwife

## 2014-05-15 ENCOUNTER — Ambulatory Visit: Payer: BC Managed Care – PPO | Admitting: Certified Nurse Midwife

## 2014-05-21 ENCOUNTER — Ambulatory Visit
Admission: RE | Admit: 2014-05-21 | Discharge: 2014-05-21 | Disposition: A | Discharge status: Home / Self Care | Payer: Managed Care, Other (non HMO) | Source: Ambulatory Visit

## 2014-05-21 ENCOUNTER — Encounter (INDEPENDENT_AMBULATORY_CARE_PROVIDER_SITE_OTHER): Payer: Self-pay

## 2014-05-21 DIAGNOSIS — Z803 Family history of malignant neoplasm of breast: Secondary | ICD-10-CM

## 2014-05-21 DIAGNOSIS — Z1231 Encounter for screening mammogram for malignant neoplasm of breast: Secondary | ICD-10-CM

## 2014-05-24 ENCOUNTER — Encounter: Payer: Self-pay | Admitting: Certified Nurse Midwife

## 2014-05-24 ENCOUNTER — Ambulatory Visit (INDEPENDENT_AMBULATORY_CARE_PROVIDER_SITE_OTHER): Payer: Managed Care, Other (non HMO) | Admitting: Certified Nurse Midwife

## 2014-05-24 VITALS — BP 134/62 | HR 64 | Resp 16 | Ht 62.0 in | Wt 155.0 lb

## 2014-05-24 DIAGNOSIS — Z0000 Encounter for general adult medical examination without abnormal findings: Secondary | ICD-10-CM

## 2014-05-24 DIAGNOSIS — N390 Urinary tract infection, site not specified: Secondary | ICD-10-CM

## 2014-05-24 DIAGNOSIS — Z01419 Encounter for gynecological examination (general) (routine) without abnormal findings: Secondary | ICD-10-CM

## 2014-05-24 DIAGNOSIS — N952 Postmenopausal atrophic vaginitis: Secondary | ICD-10-CM

## 2014-05-24 LAB — POCT URINALYSIS DIPSTICK
BILIRUBIN UA: NEGATIVE
GLUCOSE UA: NEGATIVE
Ketones, UA: NEGATIVE
NITRITE UA: POSITIVE
Protein, UA: NEGATIVE
UROBILINOGEN UA: NEGATIVE
pH, UA: 5

## 2014-05-24 MED ORDER — CIPROFLOXACIN HCL 500 MG PO TABS: 500.0000 mg | ORAL_TABLET | Freq: Two times a day (BID) | ORAL | Status: DC

## 2014-05-24 NOTE — Progress Notes (Signed)
68 y.o. G69P2002 Married Caucasian Fe here for annual exam.  Menopausal no HRT. Complaining of urinary frequency,burning and urgency for the past 2-3 days. Denies fever,chills or back pain. History of kidney stones, but no problems with recently. Some vaginal dryness, uses lubricant if needed. No new personal products. No vaginal symptoms. Sees PCP for cholesterol,hypothyroid,osteopenia and hypertension management/labs, aex. No change medication dosage in past year.No other health issues today  Patient's last menstrual period was 12/21/1986.          Sexually active: yes  The current method of family planning is status post hysterectomy.    Exercising: yes  walk Smoker:  no  Health Maintenance: Pap: 02-18-01 neg MMG: 05-21-14 neg Colonoscopy:  04/2006 10 years BMD:   12/13 TDaP:  2012 Labs: Poct urine-nitrite-pos, wbc tr, rbc 1+ Self breast exam: done monthly   reports that she has never smoked. She does not have any smokeless tobacco history on file. She reports that she does not drink alcohol or use illicit drugs.  Past Medical History  Diagnosis Date  . Hypertension   . MVP (mitral valve prolapse)   . Migraines   . Dyspareunia   . Atrophic vaginitis   . Hematuria     negative workup  . Hypercholesteremia   . Kidney stones   . Fibroid     Past Surgical History  Procedure Laterality Date  . Temporomandibular joint surgery  1985  . Abdominal hysterectomy      1985  . Dilation and curettage of uterus    . Ankle surgery Left 2010  . Cesarean section      times 2    Current Outpatient Prescriptions  Medication Sig Dispense Refill  . alendronate (FOSAMAX) 70 MG tablet Take 70 mg by mouth every 7 (seven) days. Take with a full glass of water on an empty stomach.      . ALREX 0.2 % SUSP 4 (four) times daily.      Marland Kitchen atorvastatin (LIPITOR) 10 MG tablet Take 10 mg by mouth daily.      Marland Kitchen CALCIUM PO Take by mouth daily.      . Cholecalciferol (VITAMIN D PO) Take 1,000 Int'l Units by  mouth daily.       . citalopram (CELEXA) 20 MG tablet Take 20 mg by mouth daily.      Marland Kitchen levothyroxine (SYNTHROID, LEVOTHROID) 112 MCG tablet Take 112 mcg by mouth daily before breakfast.      . raloxifene (EVISTA) 60 MG tablet Take 60 mg by mouth daily.      . verapamil (VERELAN PM) 240 MG 24 hr capsule Take 240 mg by mouth at bedtime.       No current facility-administered medications for this visit.    Family History  Problem Relation Age of Onset  . Breast cancer Mother   . Heart disease Mother   . Osteoporosis Mother   . Breast cancer Maternal Aunt   . Breast cancer Maternal Aunt   . Breast cancer Maternal Aunt     ROS:  Pertinent items are noted in HPI.  Otherwise, a comprehensive ROS was negative.  Exam:   BP 134/62  Pulse 64  Resp 16  Ht 5\' 2"  (1.575 m)  Wt 155 lb (70.308 kg)  BMI 28.34 kg/m2  LMP 12/21/1986 Height: 5\' 2"  (157.5 cm)  Ht Readings from Last 3 Encounters:  05/24/14 5\' 2"  (1.575 m)  07/06/13 5' 2.25" (1.581 m)  05/10/13 5' 2.25" (1.581 m)  General appearance: alert, cooperative and appears stated age Head: Normocephalic, without obvious abnormality, atraumatic Neck: no adenopathy, supple, symmetrical, trachea midline and thyroid normal to inspection and palpation and non-palpable Lungs: clear to auscultation bilaterally CVAT: bilateral negative Breasts: normal appearance, no masses or tenderness, No nipple retraction or dimpling, No nipple discharge or bleeding, No axillary or supraclavicular adenopathy Heart: regular rate and rhythm Abdomen: soft, non-tender; no masses,  no organomegaly, positive suprapubic tenderness Extremities: extremities normal, atraumatic, no cyanosis or edema Skin: Skin color, texture, turgor normal. No rashes or lesions, warm and dry Lymph nodes: Cervical, supraclavicular, and axillary nodes normal. No abnormal inguinal nodes palpated Neurologic: Grossly normal   Pelvic: External genitalia:  no lesions               Urethra:  normal appearing urethra with no masses, or lesions, tender, bladder and urethral meatus tender              Bartholin's and Skene's: normal                 Vagina: atrophic appearing vagina with pale color and scant discharge, no lesions              Cervix: absent              Pap taken: no Bimanual Exam:  Uterus:  uterus absent              Adnexa: normal adnexa and no mass, fullness, tenderness               Rectovaginal: Confirms               Anus:  normal sphincter tone, no lesions  A:  Well Woman with normal exam  Menopausal no HRT  UTI  Atrophic vaginitis  Osteopenia/Hypertension/Cholesterol/Hypothyroid stable with medication with PCP management  Strong family history of breast cancer, Pt. BRAC negative  P:   Reviewed health and wellness pertinent to exam  Discussed findings of UTI and need for treatment.  Encouraged to increase water intake. Warning signs give regarding UTI. Rx Cipro see order  Lab: Urine micro/culture  Discussed vaginal atrophy has increased and increases risk of UTI. Patient will not use estrogen and is on Evista(PCP management). Discussed OTC Replens to start regularly will help. Patient plans to try. Coconut oil to external area and decrease wiping with urination.  Continue follow up with PCP as indicated  Pap not taken smear taken today  counseled on breast self exam, adequate intake of calcium and vitamin D, diet and exercise  return annually or prn  An After Visit Summary was printed and given to the patient.

## 2014-05-24 NOTE — Patient Instructions (Signed)

## 2014-05-25 LAB — URINALYSIS, MICROSCOPIC ONLY
CASTS: NONE SEEN
Crystals: NONE SEEN
SQUAMOUS EPITHELIAL / LPF: NONE SEEN

## 2014-05-25 NOTE — Progress Notes (Signed)
Reviewed personally.  M. Suzanne Kati Riggenbach, MD.  

## 2014-05-27 LAB — URINE CULTURE

## 2014-05-28 ENCOUNTER — Telehealth: Payer: Self-pay

## 2014-05-28 NOTE — Telephone Encounter (Signed)
Returning a call to Joy. °

## 2014-05-28 NOTE — Addendum Note (Signed)
Addended by: Regina Eck on: 05/28/2014 08:34 AM   Modules accepted: Orders

## 2014-05-28 NOTE — Telephone Encounter (Signed)
lmtcb

## 2014-05-28 NOTE — Telephone Encounter (Signed)
Left message for call back.

## 2014-05-29 NOTE — Telephone Encounter (Signed)
Spoke with patient. Results given as seen below. Patient agreeable and verbalizes understanding. Two week nurse recheck scheduled for June 22nd at 9:00am.  Notes Recorded by Regina Eck, CNM on 05/28/2014 at 8:33 AM Notify patient Urine culture and micro positive. Culture showed E.Coli.On appropriate medication.  Needs T)C 2 weeks. No OV order in   Routing to provider for final review. Patient agreeable to disposition. Will close encounter

## 2014-06-11 ENCOUNTER — Other Ambulatory Visit: Payer: Self-pay | Admitting: Certified Nurse Midwife

## 2014-06-11 ENCOUNTER — Ambulatory Visit (INDEPENDENT_AMBULATORY_CARE_PROVIDER_SITE_OTHER): Payer: Managed Care, Other (non HMO) | Admitting: Certified Nurse Midwife

## 2014-06-11 VITALS — BP 126/70 | HR 72 | Ht 62.25 in | Wt 153.0 lb

## 2014-06-11 DIAGNOSIS — N390 Urinary tract infection, site not specified: Secondary | ICD-10-CM

## 2014-06-11 LAB — POCT URINALYSIS DIPSTICK
BILIRUBIN UA: NEGATIVE
GLUCOSE UA: NEGATIVE
KETONES UA: NEGATIVE
NITRITE UA: NEGATIVE
PH UA: 5
Protein, UA: NEGATIVE
Urobilinogen, UA: NEGATIVE

## 2014-06-11 NOTE — Addendum Note (Signed)
Addended by: Susy Manor on: 06/11/2014 01:46 PM   Modules accepted: Orders

## 2014-06-11 NOTE — Progress Notes (Signed)
Patient ID: Rose Garza, female   DOB: 09/14/1946, 68 y.o.   MRN: [id number] Patient is here for Urine TOC. She states she is still having some frequency and feeling heavy. No pain just discomfort. Patient has finished her Cipro

## 2014-06-11 NOTE — Addendum Note (Signed)
Addended by: Blanchard Kelch R on: 06/11/2014 05:07 PM   Modules accepted: Orders

## 2014-06-12 LAB — URINALYSIS, MICROSCOPIC ONLY
Bacteria, UA: NONE SEEN
CASTS: NONE SEEN
CRYSTALS: NONE SEEN
Squamous Epithelial / LPF: NONE SEEN

## 2014-06-12 LAB — URINE CULTURE
Colony Count: NO GROWTH
ORGANISM ID, BACTERIA: NO GROWTH

## 2014-06-13 ENCOUNTER — Telehealth: Payer: Self-pay

## 2014-06-13 NOTE — Telephone Encounter (Signed)
lmtcb

## 2014-06-14 NOTE — Telephone Encounter (Signed)
Patient notified of results. See lab 

## 2014-08-08 ENCOUNTER — Other Ambulatory Visit: Payer: Managed Care, Other (non HMO)

## 2014-10-22 ENCOUNTER — Encounter: Payer: Self-pay | Admitting: Certified Nurse Midwife

## 2015-05-21 ENCOUNTER — Other Ambulatory Visit: Payer: Self-pay

## 2015-05-21 DIAGNOSIS — Z1231 Encounter for screening mammogram for malignant neoplasm of breast: Secondary | ICD-10-CM

## 2015-05-28 ENCOUNTER — Encounter: Payer: Self-pay | Admitting: Certified Nurse Midwife

## 2015-05-28 ENCOUNTER — Ambulatory Visit (INDEPENDENT_AMBULATORY_CARE_PROVIDER_SITE_OTHER): Payer: Managed Care, Other (non HMO) | Admitting: Certified Nurse Midwife

## 2015-05-28 VITALS — BP 124/60 | HR 90 | Ht 62.0 in | Wt 152.6 lb

## 2015-05-28 DIAGNOSIS — Z01419 Encounter for gynecological examination (general) (routine) without abnormal findings: Secondary | ICD-10-CM

## 2015-05-28 DIAGNOSIS — Z0000 Encounter for general adult medical examination without abnormal findings: Secondary | ICD-10-CM | POA: Diagnosis not present

## 2015-05-28 DIAGNOSIS — N390 Urinary tract infection, site not specified: Secondary | ICD-10-CM | POA: Diagnosis not present

## 2015-05-28 LAB — POCT URINALYSIS DIPSTICK
Nitrite, UA: POSITIVE
Urobilinogen, UA: NEGATIVE
pH, UA: 5

## 2015-05-28 MED ORDER — CIPROFLOXACIN HCL 500 MG PO TABS: 500.0000 mg | ORAL_TABLET | Freq: Two times a day (BID) | ORAL | Status: DC

## 2015-05-28 NOTE — Progress Notes (Signed)
69 y.o. G83P2002 Married  Caucasian Fe here for annual exam.Menopausal no HRT, denies vaginal bleeding or vaginal dryness. Noticed small amount tenderness with urination in the past few days, no fever or chills or back pain. No urinary frequency, urgency. Patient has been on probiotic for past year and no UTI ! She has noticed slight vaginal dryness only. Sees PCP for aex/labs and medication management for cholesterol/hypothyroid/anxiety, no medication change. No other health concerns today. Leaving for vacation in 10 days!!  Patient's last menstrual period was 12/21/1986.          Sexually active: Yes.    The current method of family planning is status post hysterectomy.    Exercising: No.   Smoker:  no  Health Maintenance: Pap:  02/18/2001 wnl  MMG:  05/21/14 bi-rads 1: negative; scheduled for 06/17/15 SBE: yes  Colonoscopy:  2006- normal f/u due in 2016 BMD:   11/28/2012 TDaP: 2012  Labs:  PCP ; Urine Nitrates +, RBC's Trace, Leuks Small   reports that she has never smoked. She does not have any smokeless tobacco history on file. She reports that she does not drink alcohol or use illicit drugs.  Past Medical History  Diagnosis Date  . Hypertension   . MVP (mitral valve prolapse)   . Migraines   . Dyspareunia   . Atrophic vaginitis   . Hematuria     negative workup  . Hypercholesteremia   . Kidney stones   . Fibroid     Past Surgical History  Procedure Laterality Date  . Temporomandibular joint surgery  1985  . Abdominal hysterectomy      1985  . Dilation and curettage of uterus    . Ankle surgery Left 2010  . Cesarean section      times 2    Current Outpatient Prescriptions  Medication Sig Dispense Refill  . alendronate (FOSAMAX) 70 MG tablet Take 70 mg by mouth every 7 (seven) days. Take with a full glass of water on an empty stomach.    . ALREX 0.2 % SUSP 4 (four) times daily.    Marland Kitchen atorvastatin (LIPITOR) 10 MG tablet Take 10 mg by mouth daily.    Marland Kitchen CALCIUM PO Take by  mouth daily.    . Cholecalciferol (VITAMIN D PO) Take 1,000 Int'l Units by mouth daily.     . citalopram (CELEXA) 20 MG tablet Take 20 mg by mouth daily.    . verapamil (CALAN-SR) 240 MG CR tablet     . ciprofloxacin (CIPRO) 500 MG tablet Take 1 tablet (500 mg total) by mouth 2 (two) times daily. (Patient not taking: Reported on 05/28/2015) 14 tablet 0  . levothyroxine (SYNTHROID, LEVOTHROID) 112 MCG tablet Take 112 mcg by mouth daily before breakfast.    . raloxifene (EVISTA) 60 MG tablet Take 60 mg by mouth daily.    Marland Kitchen SYNTHROID 100 MCG tablet      No current facility-administered medications for this visit.    Family History  Problem Relation Age of Onset  . Breast cancer Mother   . Heart disease Mother   . Osteoporosis Mother   . Breast cancer Maternal Aunt   . Breast cancer Maternal Aunt   . Breast cancer Maternal Aunt     ROS:  Pertinent items are noted in HPI.  Otherwise, a comprehensive ROS was negative.  Exam:   Ht 5\' 2"  (1.575 m)  Wt 152 lb 9.6 oz (69.219 kg)  BMI 27.90 kg/m2  LMP 12/21/1986 Height: 5\' 2"  (  157.5 cm) Ht Readings from Last 3 Encounters:  05/28/15 5\' 2"  (1.575 m)  06/11/14 5' 2.25" (1.581 m)  05/24/14 5\' 2"  (1.575 m)    General appearance: alert, cooperative and appears stated age Head: Normocephalic, without obvious abnormality, atraumatic Neck: no adenopathy, supple, symmetrical, trachea midline and thyroid normal to inspection and palpation Lungs: clear to auscultation bilaterally CVAT bilateral negative Breasts: normal appearance, no masses or tenderness, No nipple retraction or dimpling, No nipple discharge or bleeding, No axillary or supraclavicular adenopathy Heart: regular rate and rhythm Abdomen: soft, non-tender; no masses,  no organomegaly, negative suprapubic Extremities: extremities normal, atraumatic, no cyanosis or edema Skin: Skin color, texture, turgor normal. No rashes or lesions, warm and dry Lymph nodes: Cervical, supraclavicular,  and axillary nodes normal. No abnormal inguinal nodes palpated Neurologic: Grossly normal   Pelvic: External genitalia:  no lesions, atrophic changes noted              Urethra:  normal appearing urethra with no masses, tenderness or lesions  Bladder non tender, urethral meatus slightly red, slightly tender              Bartholin's and Skene's: normal                 Vagina: atrophic appearing vagina with normal color and discharge, no lesions              Cervix: absent              Pap taken: No. Bimanual Exam:  Uterus:  uterus absent              Adnexa: no mass, fullness, tenderness               Rectovaginal: Confirms               Anus:  normal sphincter tone, no lesions  Chaperone present: Yes  A:  Well Woman with normal exam  Menopausal no HRT  Atrophic vaginitis  UTI vs urethritis  Cholesterol/hypothyroid/anxiety management with PCP  P:   Reviewed health and wellness pertinent to exam  Discussed findings and etiology. Discussed OTC Coconut oil to external and internal area 2-3 times weekly. Patient happy with this option. Also discussed vaginal dryness can increase risk of UTI.  Reviewed findings of UTI and need for treatment. Continue good water intake.Warning signs of UTI given.  Lab: urine micro and culture  Rx Cipro see order  Continue follow up as indicated.  Pap smear not taken   counseled on breast self exam, mammography screening, adequate intake of calcium and vitamin D, diet and exercise  return annually or prn  An After Visit Summary was printed and given to the patient.

## 2015-05-28 NOTE — Patient Instructions (Addendum)
EXERCISE AND DIET:  We recommended that you start or continue a regular exercise program for good health. Regular exercise means any activity that makes your heart beat faster and makes you sweat.  We recommend exercising at least 30 minutes per day at least 3 days a week, preferably 4 or 5.  We also recommend a diet low in fat and sugar.  Inactivity, poor dietary choices and obesity can cause diabetes, heart attack, stroke, and kidney damage, among others.    ALCOHOL AND SMOKING:  Women should limit their alcohol intake to no more than 7 drinks/beers/glasses of wine (combined, not each!) per week. Moderation of alcohol intake to this level decreases your risk of breast cancer and liver damage. And of course, no recreational drugs are part of a healthy lifestyle.  And absolutely no smoking or even second hand smoke. Most people know smoking can cause heart and lung diseases, but did you know it also contributes to weakening of your bones? Aging of your skin?  Yellowing of your teeth and nails?  CALCIUM AND VITAMIN D:  Adequate intake of calcium and Vitamin D are recommended.  The recommendations for exact amounts of these supplements seem to change often, but generally speaking 600 mg of calcium (either carbonate or citrate) and 800 units of Vitamin D per day seems prudent. Certain women may benefit from higher intake of Vitamin D.  If you are among these women, your doctor will have told you during your visit.    PAP SMEARS:  Pap smears, to check for cervical cancer or precancers,  have traditionally been done yearly, although recent scientific advances have shown that most women can have pap smears less often.  However, every woman still should have a physical exam from her gynecologist every year. It will include a breast check, inspection of the vulva and vagina to check for abnormal growths or skin changes, a visual exam of the cervix, and then an exam to evaluate the size and shape of the uterus and  ovaries.  And after 71 years of age, a rectal exam is indicated to check for rectal cancers. We will also provide age appropriate advice regarding health maintenance, like when you should have certain vaccines, screening for sexually transmitted diseases, bone density testing, colonoscopy, mammograms, etc.   MAMMOGRAMS:  All women over 40 years old should have a yearly mammogram. Many facilities now offer a "3D" mammogram, which may cost around $50 extra out of pocket. If possible,  we recommend you accept the option to have the 3D mammogram performed.  It both reduces the number of women who will be called back for extra views which then turn out to be normal, and it is better than the routine mammogram at detecting truly abnormal areas.    COLONOSCOPY:  Colonoscopy to screen for colon cancer is recommended for all women at age 50.  We know, you hate the idea of the prep.  We agree, BUT, having colon cancer and not knowing it is worse!!  Colon cancer so often starts as a polyp that can be seen and removed at colonscopy, which can quite literally save your life!  And if your first colonoscopy is normal and you have no family history of colon cancer, most women don't have to have it again for 10 years.  Once every ten years, you can do something that may end up saving your life, right?  We will be happy to help you get it scheduled when you are ready.    Be sure to check your insurance coverage so you understand how much it will cost.  It may be covered as a preventative service at no cost, but you should check your particular policy.     Urinary Tract Infection Urinary tract infections (UTIs) can develop anywhere along your urinary tract. Your urinary tract is your body's drainage system for removing wastes and extra water. Your urinary tract includes two kidneys, two ureters, a bladder, and a urethra. Your kidneys are a pair of bean-shaped organs. Each kidney is about the size of your fist. They are located  below your ribs, one on each side of your spine. CAUSES Infections are caused by microbes, which are microscopic organisms, including fungi, viruses, and bacteria. These organisms are so small that they can only be seen through a microscope. Bacteria are the microbes that most commonly cause UTIs. SYMPTOMS  Symptoms of UTIs may vary by age and gender of the patient and by the location of the infection. Symptoms in young women typically include a frequent and intense urge to urinate and a painful, burning feeling in the bladder or urethra during urination. Older women and men are more likely to be tired, shaky, and weak and have muscle aches and abdominal pain. A fever may mean the infection is in your kidneys. Other symptoms of a kidney infection include pain in your back or sides below the ribs, nausea, and vomiting. DIAGNOSIS To diagnose a UTI, your caregiver will ask you about your symptoms. Your caregiver also will ask to provide a urine sample. The urine sample will be tested for bacteria and white blood cells. White blood cells are made by your body to help fight infection. TREATMENT  Typically, UTIs can be treated with medication. Because most UTIs are caused by a bacterial infection, they usually can be treated with the use of antibiotics. The choice of antibiotic and length of treatment depend on your symptoms and the type of bacteria causing your infection. HOME CARE INSTRUCTIONS  If you were prescribed antibiotics, take them exactly as your caregiver instructs you. Finish the medication even if you feel better after you have only taken some of the medication.  Drink enough water and fluids to keep your urine clear or pale yellow.  Avoid caffeine, tea, and carbonated beverages. They tend to irritate your bladder.  Empty your bladder often. Avoid holding urine for long periods of time.  Empty your bladder before and after sexual intercourse.  After a bowel movement, women should cleanse  from front to back. Use each tissue only once. SEEK MEDICAL CARE IF:   You have back pain.  You develop a fever.  Your symptoms do not begin to resolve within 3 days. SEEK IMMEDIATE MEDICAL CARE IF:   You have severe back pain or lower abdominal pain.  You develop chills.  You have nausea or vomiting.  You have continued burning or discomfort with urination. MAKE SURE YOU:   Understand these instructions.  Will watch your condition.  Will get help right away if you are not doing well or get worse. Document Released: 09/16/2005 Document Revised: 06/07/2012 Document Reviewed: 01/15/2012 [facility] Patient Information 2015 [place], [place]. This information is not intended to replace advice given to you by your health care provider. Make sure you discuss any questions you have with your health care provider.

## 2015-05-28 NOTE — Progress Notes (Signed)
Reviewed personally.  M. Suzanne Allen Egerton, MD.  

## 2015-05-29 LAB — URINALYSIS, MICROSCOPIC ONLY
CASTS: NONE SEEN
Crystals: NONE SEEN
Squamous Epithelial / LPF: NONE SEEN

## 2015-05-31 ENCOUNTER — Other Ambulatory Visit: Payer: Self-pay | Admitting: Family Medicine

## 2015-05-31 DIAGNOSIS — M810 Age-related osteoporosis without current pathological fracture: Secondary | ICD-10-CM

## 2015-05-31 LAB — URINE CULTURE

## 2015-06-17 ENCOUNTER — Ambulatory Visit
Admission: RE | Admit: 2015-06-17 | Discharge: 2015-06-17 | Disposition: A | Discharge status: Home / Self Care | Payer: Managed Care, Other (non HMO) | Source: Ambulatory Visit

## 2015-06-17 DIAGNOSIS — Z1231 Encounter for screening mammogram for malignant neoplasm of breast: Secondary | ICD-10-CM

## 2015-06-18 ENCOUNTER — Other Ambulatory Visit: Payer: Self-pay

## 2015-06-18 ENCOUNTER — Other Ambulatory Visit: Payer: Self-pay | Admitting: Family Medicine

## 2015-06-18 ENCOUNTER — Ambulatory Visit (HOSPITAL_COMMUNITY): Payer: Managed Care, Other (non HMO) | Attending: Internal Medicine

## 2015-06-18 ENCOUNTER — Other Ambulatory Visit (HOSPITAL_COMMUNITY): Payer: Managed Care, Other (non HMO)

## 2015-06-18 DIAGNOSIS — I700 Atherosclerosis of aorta: Secondary | ICD-10-CM | POA: Insufficient documentation

## 2015-06-18 DIAGNOSIS — I341 Nonrheumatic mitral (valve) prolapse: Secondary | ICD-10-CM | POA: Insufficient documentation

## 2015-06-18 DIAGNOSIS — I340 Nonrheumatic mitral (valve) insufficiency: Secondary | ICD-10-CM | POA: Insufficient documentation

## 2015-06-18 DIAGNOSIS — I351 Nonrheumatic aortic (valve) insufficiency: Secondary | ICD-10-CM

## 2015-06-21 DIAGNOSIS — M85859 Other specified disorders of bone density and structure, unspecified thigh: Secondary | ICD-10-CM

## 2015-06-21 HISTORY — DX: Other specified disorders of bone density and structure, unspecified thigh: M85.859

## 2015-06-25 ENCOUNTER — Ambulatory Visit
Admission: RE | Admit: 2015-06-25 | Discharge: 2015-06-25 | Disposition: A | Discharge status: Home / Self Care | Payer: 59 | Source: Ambulatory Visit | Attending: Family Medicine | Admitting: Family Medicine

## 2015-06-25 DIAGNOSIS — M810 Age-related osteoporosis without current pathological fracture: Secondary | ICD-10-CM

## 2015-06-26 ENCOUNTER — Other Ambulatory Visit (HOSPITAL_COMMUNITY): Payer: Self-pay | Admitting: *Deleted

## 2015-06-27 ENCOUNTER — Ambulatory Visit (HOSPITAL_COMMUNITY)
Admission: RE | Admit: 2015-06-27 | Discharge: 2015-06-27 | Disposition: A | Discharge status: Home / Self Care | Payer: 59 | Source: Ambulatory Visit | Attending: Gastroenterology | Admitting: Gastroenterology

## 2015-06-27 MED ORDER — LIDOCAINE HCL 2 % IJ SOLN: 0.1000 mL | Freq: Once | INTRAMUSCULAR | Status: DC

## 2015-06-27 MED ORDER — LIDOCAINE HCL (PF) 1 % IJ SOLN
INTRAMUSCULAR | Status: AC
  Administered 2015-06-27: 0.1 mL via INTRADERMAL
  Filled 2015-06-27: qty 2

## 2015-06-27 NOTE — Progress Notes (Signed)
Patient came to have her first phlebotomy done.  First attempt was with the left arm AC.  Lidocaine was applied and 213cc of blood waste was removed.  Lidocaine was then applied to the right AC and the remaining 287cc of blood waste was removed to complete the ordered 500cc.  Patient tolerated the procedure well, but then became hot, dizzy, and vomited x 2.  Patient was reclined and cool cloth applied to forehead.  After several minutes, the patient began to feel better.  Vital signs were stable.  Patient was able to be discharged home.

## 2015-06-27 NOTE — Discharge Instructions (Signed)

## 2015-07-08 ENCOUNTER — Encounter: Payer: Self-pay | Admitting: Genetic Counselor

## 2015-07-15 ENCOUNTER — Encounter: Payer: Self-pay | Admitting: Nurse Practitioner

## 2015-07-15 ENCOUNTER — Ambulatory Visit (INDEPENDENT_AMBULATORY_CARE_PROVIDER_SITE_OTHER): Payer: Managed Care, Other (non HMO) | Admitting: Nurse Practitioner

## 2015-07-15 VITALS — BP 130/72 | HR 60 | Temp 97.3°F | Ht 62.0 in | Wt 152.0 lb

## 2015-07-15 DIAGNOSIS — R350 Frequency of micturition: Secondary | ICD-10-CM | POA: Diagnosis not present

## 2015-07-15 LAB — POCT URINALYSIS DIPSTICK
Bilirubin, UA: NEGATIVE
Glucose, UA: NEGATIVE
KETONES UA: NEGATIVE
NITRITE UA: POSITIVE
Protein, UA: NEGATIVE
Urobilinogen, UA: NEGATIVE
pH, UA: 5

## 2015-07-15 MED ORDER — SULFAMETHOXAZOLE-TRIMETHOPRIM 800-160 MG PO TABS: 1.0000 | ORAL_TABLET | Freq: Two times a day (BID) | ORAL | Status: DC

## 2015-07-15 NOTE — Patient Instructions (Signed)

## 2015-07-15 NOTE — Progress Notes (Signed)
S:  69 y.o.MW female Budd Palmer presents with complaint of UTI. Symptoms began on last Wednesday but progressively worse yesterday.   With symptoms of dysuria, urinary frequency, urinary urgency, nocturia X 4.   Pertinent negatives include the patient is having no constitutional symptoms, denying fever, chills, anorexia, or weight loss.  Sexually active yes.    Symptoms might be related to post coital - not sure always a trigger but sometimes notes symptoms at 48 hours later.    Menopausal with vaginal dryness.  Same partner without change. Last UTI documented 05/28/15 and treated with Cipro and did well-  but did not come in for a TOC. History of renal calculi about 2 years ago, she has had slight pain lower back and has thought about this as a possibility.  Last scan showed multiple small stones. Denies fever and chills.  She had been on cranberry tablets and a probiotic and felt well for this past year until past exam revealed this last UTI.   ROS: no weight loss, fever, night sweats  O Alert, oriented to person, place, and time   Healthy,  alert,  not in acute distress, well developed and well nourished  Mild lower suprapubic pressure  No CVA tenderness  Pelvic:  deferred   Diagnostic Test:    Urinalysis + RBC, Pos nitrate, moderate leuk's   urine culture and micro to the lab  Assessment: R/O UTI   Recent UTI in June and treated with Cipro   History of renal calculi   Plan:  Maintain adequate hydration. Follow up if symptoms not improving, and as needed.   Medication Therapy: Septra DS BID # [id number]   Lab:TOC in 2 weeks - if infection is not cleared then will make a referral back to Urologist.  If symptoms worsens the same.        RV

## 2015-07-16 ENCOUNTER — Telehealth: Payer: Self-pay | Admitting: Nurse Practitioner

## 2015-07-16 LAB — URINALYSIS, MICROSCOPIC ONLY
CASTS: NONE SEEN
CRYSTALS: NONE SEEN
Squamous Epithelial / LPF: NONE SEEN
WBC, UA: >50 WBC/hpf — AB (ref <3)

## 2015-07-16 NOTE — Telephone Encounter (Signed)
Patient is having a reaction to the medication prescribed to her yesterday. Patent says her "lips are burning and peeling".

## 2015-07-16 NOTE — Telephone Encounter (Signed)
Spoke with patient. Was seen in office yesterday for UTI symptoms. Patient states that she took Bactrim DS yesterday and 3-4 hours later started to have lip "tingling." Took another dose before bed. Woke up at 4 am with swollen lips and cheeks. Denies any difficulty breathing or swallowing. Advised to discontinue taking medication and may take benadryl for swelling. Bactrim DS is added to her allergy list. Advised will speak with Milford Cage, FNP regarding medication change for UTI and return call with further recommendations. Patient is agreeable.

## 2015-07-16 NOTE — Telephone Encounter (Signed)
Yes will follow with urine C&S tomorrow.

## 2015-07-16 NOTE — Telephone Encounter (Signed)
Spoke with patient. Advised I have spoken with Milford Cage, FNP who recommends that patient increase water intake and continue to take benadryl as needed. Advised we are awaiting return of urine culture and will be in contact with her to discuss results and start on new medication. Patient states that she is not currently experiencing any urinary symptoms. Advised if symptoms return over night may use OTC AZO for relief. Patient is agreeable.  Milford Cage, FNP agree with recommendations?

## 2015-07-18 ENCOUNTER — Telehealth: Payer: Self-pay | Admitting: Emergency Medicine

## 2015-07-18 NOTE — Telephone Encounter (Signed)
Call to patient per Regina Eck CNM.  Patient is scheduled for cath UA with provider 07/19/15 at 1100.  Patient denies symptoms at this time except for dysuria. Denies fevers, chills, nausea, abdominal pain or flank pain. Advised patient if develops any concerning symptoms to please seek care at local emergency department. Patient verbalized understanding of instructions and follow up plan.   Routing to provider for final review. Patient agreeable to disposition. Will close encounter.

## 2015-07-19 ENCOUNTER — Encounter: Payer: Self-pay | Admitting: Certified Nurse Midwife

## 2015-07-19 ENCOUNTER — Ambulatory Visit (INDEPENDENT_AMBULATORY_CARE_PROVIDER_SITE_OTHER): Payer: Managed Care, Other (non HMO) | Admitting: Certified Nurse Midwife

## 2015-07-19 VITALS — BP 120/78 | HR 70 | Temp 97.8°F | Resp 16 | Ht 62.0 in | Wt 152.0 lb

## 2015-07-19 DIAGNOSIS — N390 Urinary tract infection, site not specified: Secondary | ICD-10-CM

## 2015-07-19 MED ORDER — NITROFURANTOIN MONOHYD MACRO 100 MG PO CAPS: 100.0000 mg | ORAL_CAPSULE | Freq: Two times a day (BID) | ORAL | Status: DC

## 2015-07-19 NOTE — Patient Instructions (Signed)

## 2015-07-19 NOTE — Progress Notes (Signed)
69 y.o.Married white female g2p2002 here for cath urine for further sensitivity with antibiotic use for E. Coli UTI. Patient was treated with Bactrim and had allergic reaction to sulfa. Previous history of allergy, but had forgotten Took 2 doses.. Patient complaining of urinary frequency/urgency/ and slight pain with urination. Patient denies fever, chills, nausea or back pain. Culture senstivity was limited due to resistance. Patient also thought she was allergic to Macrobid so this order was cancelled. Patient now remembers it was only Sulfa. Review of chart shows patient treated with Macrobid without problems.  O: Healthy female WDWN Affect: Normal, orientation x 3 Skin : warm and dry CVAT: negative bilateral Abdomen: positive for suprapubic tenderness  Pelvic exam: External genital area: normal, no lesions Bladder,Urethra tender, Urethral meatus: tender, red Vagina: normal vaginal discharge, normal appearance   Cervix: normal, non tender Uterus:normal,non tender Adnexa: normal non tender, no fullness or masses    A: UTI E. Coli per culture with limited sensitivity to oral medications Allergy to sulfa, but not to Macrobid Normal pelvic exam  P:Discussed with patient will proceed with treatment with Macrobid but if symptoms of allergy need to use benadryl and advise. Patient agreeable. Discussed proceed with cath. Urine if Macrobid does not clear bacteria, patient agreeable to catherization  Procedure: Urethral meatus visualized and under sterile technique cleansed with betadine solution x 3. In and out catheter inserted and urine obtain. Catheter removed and specimen sent to lab. Patient tolerated procedure well.   Reviewed findings of UTI and need for treatment. OA:[id number] see order [id number] micro, culture Reviewed warning signs and symptoms of UTI and need to advise if occurring. Encouraged to limit soda, tea, and coffee and increase water.  TOC in 2 weeks with office  visit   RV prn

## 2015-07-20 LAB — URINE CULTURE

## 2015-07-20 LAB — URINALYSIS, MICROSCOPIC ONLY
CRYSTALS: NONE SEEN [HPF]
Casts: NONE SEEN [LPF]
Squamous Epithelial / LPF: NONE SEEN [HPF] (ref <=5)
YEAST: NONE SEEN [HPF]

## 2015-07-20 NOTE — Progress Notes (Signed)
Encounter reviewed by Dr. Lyrick Worland Amundson C. Silva.  

## 2015-07-20 NOTE — Progress Notes (Signed)
Encounter reviewed by Dr. Lorry Furber Amundson C. Silva.  

## 2015-07-23 LAB — URINE CULTURE

## 2015-07-29 ENCOUNTER — Ambulatory Visit: Payer: Managed Care, Other (non HMO) | Admitting: Nurse Practitioner

## 2015-08-02 ENCOUNTER — Encounter: Payer: Self-pay | Admitting: Certified Nurse Midwife

## 2015-08-02 ENCOUNTER — Ambulatory Visit (INDEPENDENT_AMBULATORY_CARE_PROVIDER_SITE_OTHER): Payer: Managed Care, Other (non HMO) | Admitting: Certified Nurse Midwife

## 2015-08-02 VITALS — BP 106/60 | HR 64 | Resp 16 | Ht 62.0 in | Wt 154.0 lb

## 2015-08-02 DIAGNOSIS — N390 Urinary tract infection, site not specified: Secondary | ICD-10-CM

## 2015-08-02 LAB — POCT URINALYSIS DIPSTICK
BILIRUBIN UA: NEGATIVE
Glucose, UA: NEGATIVE
Ketones, UA: NEGATIVE
NITRITE UA: NEGATIVE
PROTEIN UA: NEGATIVE
Urobilinogen, UA: NEGATIVE
pH, UA: 5

## 2015-08-02 NOTE — Progress Notes (Signed)
Reviewed personally.  M. Suzanne Marchetta Navratil, MD.  

## 2015-08-02 NOTE — Progress Notes (Signed)
69 y.o. Married Caucasian female [id number] here for follow up of E.Coli UTI treated with Macrobid initiated on 07/19/15. Previous use of Bactrim with allergic reaction.Completed all medication as directed.  Denies any symptoms of frequency, urgency or pain with urination. Here for TOC today. Patient aware she has limited options with treatment with E. Coli due medication sensitivity. Macrobid has worked well. Has been working on water intake and vaginal dryness. No other health issues today. Not here for exam.  O: Healthy WD,WN female Affect: Normal POCT urine tr WBC  A:UTI probably Resolved  History of E. Coli UTI  P: Discussed findings of urine and will send culture to make sure resolved. Start on cranberry tablets daily and stay on. Use Coconut oil daily for dryness. Do not hold urine long periods of time or pads, only as necessary. Patient agreeable and will advise if any symptoms. Labs Urine Culture  Time spent in discussion regarding UTI management 16 minutes  RV prn

## 2015-08-03 LAB — URINE CULTURE
COLONY COUNT: NO GROWTH
Organism ID, Bacteria: NO GROWTH

## 2015-08-03 LAB — URINALYSIS, MICROSCOPIC ONLY
Bacteria, UA: NONE SEEN [HPF]
CRYSTALS: NONE SEEN [HPF]
Casts: NONE SEEN [LPF]
Yeast: NONE SEEN [HPF]

## 2015-08-14 ENCOUNTER — Other Ambulatory Visit (HOSPITAL_COMMUNITY): Payer: Self-pay | Admitting: *Deleted

## 2015-08-15 ENCOUNTER — Encounter (HOSPITAL_COMMUNITY)
Admission: RE | Admit: 2015-08-15 | Discharge: 2015-08-15 | Disposition: A | Discharge status: Home / Self Care | Payer: 59 | Source: Ambulatory Visit | Attending: Gastroenterology | Admitting: Gastroenterology

## 2015-08-15 MED ORDER — LIDOCAINE HCL (PF) 2 % IJ SOLN: 2.0000 mL | Freq: Once | INTRAMUSCULAR | Status: DC

## 2015-08-15 MED ORDER — LIDOCAINE HCL (PF) 2 % IJ SOLN
INTRAMUSCULAR | Status: DC
Start: 2015-08-15 — End: 2015-08-16
  Filled 2015-08-15: qty 10

## 2015-10-03 ENCOUNTER — Encounter (HOSPITAL_COMMUNITY): Payer: 59

## 2015-10-09 ENCOUNTER — Other Ambulatory Visit (HOSPITAL_COMMUNITY): Payer: Self-pay | Admitting: *Deleted

## 2015-10-10 ENCOUNTER — Encounter (HOSPITAL_COMMUNITY)
Admission: RE | Admit: 2015-10-10 | Discharge: 2015-10-10 | Disposition: A | Discharge status: Home / Self Care | Payer: 59 | Source: Ambulatory Visit | Attending: Gastroenterology | Admitting: Gastroenterology

## 2015-10-10 LAB — POCT HEMOGLOBIN-HEMACUE: HEMOGLOBIN: 11.6 g/dL — AB (ref 12.0–15.0)

## 2015-10-10 MED ORDER — LIDOCAINE HCL 2 % IJ SOLN: 0.1000 mL | Freq: Once | INTRAMUSCULAR | Status: DC

## 2015-10-10 MED ORDER — LIDOCAINE HCL (PF) 1 % IJ SOLN
INTRAMUSCULAR | Status: AC
  Filled 2015-10-10: qty 2

## 2015-10-10 NOTE — Progress Notes (Signed)
Therapeutic phlebotomy performed without difficulty.  Lidocaine used, 500cc blood removed.  Pt tolerated the procedure without complaint.  Will monitor prior to discharge.

## 2015-11-20 ENCOUNTER — Other Ambulatory Visit (HOSPITAL_COMMUNITY): Payer: Self-pay | Admitting: *Deleted

## 2015-11-21 ENCOUNTER — Encounter (HOSPITAL_COMMUNITY)
Admission: RE | Admit: 2015-11-21 | Discharge: 2015-11-21 | Disposition: A | Discharge status: Home / Self Care | Payer: 59 | Source: Ambulatory Visit | Attending: Gastroenterology | Admitting: Gastroenterology

## 2015-11-21 LAB — POCT HEMOGLOBIN-HEMACUE: HEMOGLOBIN: 13.8 g/dL (ref 12.0–15.0)

## 2015-11-21 MED ORDER — LIDOCAINE HCL (PF) 2 % IJ SOLN
INTRAMUSCULAR | Status: AC
  Filled 2015-11-21: qty 10

## 2015-11-21 MED ORDER — LIDOCAINE HCL 2 % IJ SOLN
0.1000 mL | Freq: Once | INTRAMUSCULAR | Status: AC
  Administered 2015-11-21: 2 mg via INTRADERMAL

## 2015-11-21 NOTE — Progress Notes (Signed)
Pt came in today for scheduled phlebotomy  Her Hgb prior to procedure was 13.4  Right Ac was used.  500 cc was removed. Pt tolerated procedure well

## 2016-05-29 ENCOUNTER — Other Ambulatory Visit: Payer: Self-pay | Admitting: Advanced Practice Midwife

## 2016-05-29 DIAGNOSIS — Z1231 Encounter for screening mammogram for malignant neoplasm of breast: Secondary | ICD-10-CM

## 2016-06-02 ENCOUNTER — Encounter: Payer: Self-pay | Admitting: Certified Nurse Midwife

## 2016-06-02 ENCOUNTER — Ambulatory Visit (INDEPENDENT_AMBULATORY_CARE_PROVIDER_SITE_OTHER): Payer: Managed Care, Other (non HMO) | Admitting: Certified Nurse Midwife

## 2016-06-02 VITALS — BP 120/68 | HR 70 | Resp 16 | Ht 62.0 in | Wt 155.0 lb

## 2016-06-02 DIAGNOSIS — Z0000 Encounter for general adult medical examination without abnormal findings: Secondary | ICD-10-CM

## 2016-06-02 DIAGNOSIS — N390 Urinary tract infection, site not specified: Secondary | ICD-10-CM

## 2016-06-02 DIAGNOSIS — Z01419 Encounter for gynecological examination (general) (routine) without abnormal findings: Secondary | ICD-10-CM

## 2016-06-02 DIAGNOSIS — N952 Postmenopausal atrophic vaginitis: Secondary | ICD-10-CM

## 2016-06-02 LAB — POCT URINALYSIS DIPSTICK
Bilirubin, UA: NEGATIVE
Glucose, UA: NEGATIVE
Ketones, UA: NEGATIVE
NITRITE UA: POSITIVE
PROTEIN UA: NEGATIVE
UROBILINOGEN UA: NEGATIVE
pH, UA: 5

## 2016-06-02 LAB — HIV ANTIBODY (ROUTINE TESTING W REFLEX): HIV: NONREACTIVE

## 2016-06-02 LAB — HEPATITIS C ANTIBODY: HCV Ab: NEGATIVE

## 2016-06-02 MED ORDER — NITROFURANTOIN MONOHYD MACRO 100 MG PO CAPS: 100.0000 mg | ORAL_CAPSULE | Freq: Two times a day (BID) | ORAL | Status: DC

## 2016-06-02 NOTE — Patient Instructions (Addendum)
EXERCISE AND DIET:  We recommended that you start or continue a regular exercise program for good health. Regular exercise means any activity that makes your heart beat faster and makes you sweat.  We recommend exercising at least 30 minutes per day at least 3 days a week, preferably 4 or 5.  We also recommend a diet low in fat and sugar.  Inactivity, poor dietary choices and obesity can cause diabetes, heart attack, stroke, and kidney damage, among others.    ALCOHOL AND SMOKING:  Women should limit their alcohol intake to no more than 7 drinks/beers/glasses of wine (combined, not each!) per week. Moderation of alcohol intake to this level decreases your risk of breast cancer and liver damage. And of course, no recreational drugs are part of a healthy lifestyle.  And absolutely no smoking or even second hand smoke. Most people know smoking can cause heart and lung diseases, but did you know it also contributes to weakening of your bones? Aging of your skin?  Yellowing of your teeth and nails?  CALCIUM AND VITAMIN D:  Adequate intake of calcium and Vitamin D are recommended.  The recommendations for exact amounts of these supplements seem to change often, but generally speaking 600 mg of calcium (either carbonate or citrate) and 800 units of Vitamin D per day seems prudent. Certain women may benefit from higher intake of Vitamin D.  If you are among these women, your doctor will have told you during your visit.    PAP SMEARS:  Pap smears, to check for cervical cancer or precancers,  have traditionally been done yearly, although recent scientific advances have shown that most women can have pap smears less often.  However, every woman still should have a physical exam from her gynecologist every year. It will include a breast check, inspection of the vulva and vagina to check for abnormal growths or skin changes, a visual exam of the cervix, and then an exam to evaluate the size and shape of the uterus and  ovaries.  And after 72 years of age, a rectal exam is indicated to check for rectal cancers. We will also provide age appropriate advice regarding health maintenance, like when you should have certain vaccines, screening for sexually transmitted diseases, bone density testing, colonoscopy, mammograms, etc.   MAMMOGRAMS:  All women over 40 years old should have a yearly mammogram. Many facilities now offer a "3D" mammogram, which may cost around $50 extra out of pocket. If possible,  we recommend you accept the option to have the 3D mammogram performed.  It both reduces the number of women who will be called back for extra views which then turn out to be normal, and it is better than the routine mammogram at detecting truly abnormal areas.    COLONOSCOPY:  Colonoscopy to screen for colon cancer is recommended for all women at age 50.  We know, you hate the idea of the prep.  We agree, BUT, having colon cancer and not knowing it is worse!!  Colon cancer so often starts as a polyp that can be seen and removed at colonscopy, which can quite literally save your life!  And if your first colonoscopy is normal and you have no family history of colon cancer, most women don't have to have it again for 10 years.  Once every ten years, you can do something that may end up saving your life, right?  We will be happy to help you get it scheduled when you are ready.    Be sure to check your insurance coverage so you understand how much it will cost.  It may be covered as a preventative service at no cost, but you should check your particular policy.     Urinary Tract Infection Urinary tract infections (UTIs) can develop anywhere along your urinary tract. Your urinary tract is your body's drainage system for removing wastes and extra water. Your urinary tract includes two kidneys, two ureters, a bladder, and a urethra. Your kidneys are a pair of bean-shaped organs. Each kidney is about the size of your fist. They are located  below your ribs, one on each side of your spine. CAUSES Infections are caused by microbes, which are microscopic organisms, including fungi, viruses, and bacteria. These organisms are so small that they can only be seen through a microscope. Bacteria are the microbes that most commonly cause UTIs. SYMPTOMS  Symptoms of UTIs may vary by age and gender of the patient and by the location of the infection. Symptoms in young women typically include a frequent and intense urge to urinate and a painful, burning feeling in the bladder or urethra during urination. Older women and men are more likely to be tired, shaky, and weak and have muscle aches and abdominal pain. A fever may mean the infection is in your kidneys. Other symptoms of a kidney infection include pain in your back or sides below the ribs, nausea, and vomiting. DIAGNOSIS To diagnose a UTI, your caregiver will ask you about your symptoms. Your caregiver will also ask you to provide a urine sample. The urine sample will be tested for bacteria and white blood cells. White blood cells are made by your body to help fight infection. TREATMENT  Typically, UTIs can be treated with medication. Because most UTIs are caused by a bacterial infection, they usually can be treated with the use of antibiotics. The choice of antibiotic and length of treatment depend on your symptoms and the type of bacteria causing your infection. HOME CARE INSTRUCTIONS  If you were prescribed antibiotics, take them exactly as your caregiver instructs you. Finish the medication even if you feel better after you have only taken some of the medication.  Drink enough water and fluids to keep your urine clear or pale yellow.  Avoid caffeine, tea, and carbonated beverages. They tend to irritate your bladder.  Empty your bladder often. Avoid holding urine for long periods of time.  Empty your bladder before and after sexual intercourse.  After a bowel movement, women should  cleanse from front to back. Use each tissue only once. SEEK MEDICAL CARE IF:   You have back pain.  You develop a fever.  Your symptoms do not begin to resolve within 3 days. SEEK IMMEDIATE MEDICAL CARE IF:   You have severe back pain or lower abdominal pain.  You develop chills.  You have nausea or vomiting.  You have continued burning or discomfort with urination. MAKE SURE YOU:   Understand these instructions.  Will watch your condition.  Will get help right away if you are not doing well or get worse.   This information is not intended to replace advice given to you by your health care provider. Make sure you discuss any questions you have with your health care provider.   Document Released: 09/16/2005 Document Revised: 08/28/2015 Document Reviewed: 01/15/2012 Elsevier Interactive Patient Education [employer].

## 2016-06-02 NOTE — Progress Notes (Signed)
70 y.o. G70P2002 Married  Caucasian Fe here for annual exam.  Menopausal no HRT. Denies vaginal dryness or bleeding. Recent colonoscopy all normal. Complaining urinary frequency and irritation. Has been on cranberry tablets and probiotic and no UTI's. Denies fever or chills, dysuria. Symptom onset two days ago. Currently having hemorrhoid banding with Dr. Earlean Shawl, no rectal exams! Sees PCP for osteoporosis,cholesterol, anxiety, hypothyroid management/labs, aex. All stable at present. No other health issues today. Planning vacation.  Patient's last menstrual period was 12/21/1986 (approximate).          Sexually active: Yes.    The current method of family planning is status post hysterectomy.    Exercising: No.  walking Smoker:  no  Health Maintenance: Pap:  02-18-2001 wnl MMG:  06-17-2015 category c birads 1:neg Colonoscopy:  5/17 5 polyps negative, 5 year plan BMD:   2016  osteoporosis TDaP:  2012 Shingles: 2014 Pneumonia: 2015 Hep C and HIV: not done Labs: poct urine- nitrite positive, rbc tr, wbc tr Self breast exam: done monthly   reports that she has never smoked. She has never used smokeless tobacco. She reports that she does not drink alcohol or use illicit drugs.  Past Medical History  Diagnosis Date  . Hypertension   . MVP (mitral valve prolapse)   . Migraines   . Dyspareunia   . Atrophic vaginitis   . Hematuria     negative workup  . Hypercholesteremia   . Kidney stones   . Fibroid     Past Surgical History  Procedure Laterality Date  . Temporomandibular joint surgery  1985  . Abdominal hysterectomy      1985  . Dilation and curettage of uterus    . Ankle surgery Left 2010  . Cesarean section      times 2    Current Outpatient Prescriptions  Medication Sig Dispense Refill  . alendronate (FOSAMAX) 70 MG tablet Take 70 mg by mouth every 7 (seven) days. Take with a full glass of water on an empty stomach.    Marland Kitchen atorvastatin (LIPITOR) 10 MG tablet Take 10 mg by  mouth daily.    . Cholecalciferol (VITAMIN D PO) Take 1,000 Int'l Units by mouth daily.     . citalopram (CELEXA) 20 MG tablet Take 20 mg by mouth daily.    . raloxifene (EVISTA) 60 MG tablet     . SYNTHROID 100 MCG tablet     . verapamil (CALAN-SR) 240 MG CR tablet      No current facility-administered medications for this visit.    Family History  Problem Relation Age of Onset  . Breast cancer Mother   . Heart disease Mother   . Osteoporosis Mother   . Breast cancer Maternal Aunt   . Breast cancer Maternal Aunt   . Breast cancer Maternal Aunt     ROS:  Pertinent items are noted in HPI.  Otherwise, a comprehensive ROS was negative.  Exam:   BP 120/68 mmHg  Pulse 70  Resp 16  Ht 5\' 2"  (1.575 m)  Wt 155 lb (70.308 kg)  BMI 28.34 kg/m2  LMP 12/21/1986 (Approximate) Height: 5\' 2"  (157.5 cm) Ht Readings from Last 3 Encounters:  06/02/16 5\' 2"  (1.575 m)  11/21/15 5\' 2"  (1.575 m)  10/10/15 5\' 2"  (1.575 m)    General appearance: alert, cooperative and appears stated age Head: Normocephalic, without obvious abnormality, atraumatic Neck: no adenopathy, supple, symmetrical, trachea midline and thyroid normal to inspection and palpation Lungs: clear to auscultation bilaterally  CVAT negative bilateral Breasts: normal appearance, no masses or tenderness, No nipple retraction or dimpling, No nipple discharge or bleeding, No axillary or supraclavicular adenopathy Heart: regular rate and rhythm Abdomen: soft, non-tender; no masses,  no organomegaly, positive suprapubic Extremities: extremities normal, atraumatic, no cyanosis or edema Skin: Skin color, texture, turgor normal. No rashes or lesions, warm and dry Lymph nodes: Cervical, supraclavicular, and axillary nodes normal. No abnormal inguinal nodes palpated Neurologic: Grossly normal   Pelvic: External genitalia:  no lesions, normal female              Urethra:  normal appearing urethra with no masses,slight tenderness or  lesions  Urethral meatus red, slight tenderness  Bladder slight tenderness              Bartholin's and Skene's: normal                 Vagina:atrophic appearing vagina with normal color and discharge, no lesions              Cervix: absent              Pap taken: No. Bimanual Exam:  Uterus:  uterus absent              Adnexa: no mass, fullness, tenderness               Rectovaginal: Confirms               Anus:  normal sphincter tone, no lesions  Chaperone present: yes  A:  Well Woman with normal exam  Menopausal no HRT. S/P TAH with BSO for menorrhagia  UTI  Atrophic vaginitis using coconut oil with good response  Hypothyroid,cholesterol/anxiety/osteoporosis with PCP management  Hemorrhoid banding in process with GI  P:   Reviewed health and wellness pertinent to exam  Discussed UTI findings and need for treatment. Discussed importance of good water intake.(patient feels it is due to all the activity around that area with hemorrhoids that may have encouraged it). Warning signs of UTI given and need to advise.  Rx Macrobid with instructions, see order  Lab: Urine micro/culture  Discussed continue use with coconut oil for dryness, which also has helped with decrease UTI occurrence.  Continue follow up with MD as indicated.  Pap smear as above not taken   counseled on breast self exam, mammography screening, adequate intake of calcium and vitamin D, diet and exercise  return annually or prn  An After Visit Summary was printed and given to the patient.

## 2016-06-03 LAB — URINALYSIS, MICROSCOPIC ONLY
Casts: NONE SEEN [LPF]
Crystals: NONE SEEN [HPF]
RBC / HPF: NONE SEEN RBC/HPF (ref <=2)
SQUAMOUS EPITHELIAL / LPF: NONE SEEN [HPF] (ref <=5)
YEAST: NONE SEEN [HPF]

## 2016-06-03 NOTE — Progress Notes (Signed)
Reviewed personally.  M. Suzanne Rosangela Fehrenbach, MD.  

## 2016-06-04 LAB — URINE CULTURE

## 2016-06-18 ENCOUNTER — Ambulatory Visit
Admission: RE | Admit: 2016-06-18 | Discharge: 2016-06-18 | Disposition: A | Discharge status: Home / Self Care | Payer: 59 | Source: Ambulatory Visit | Attending: Advanced Practice Midwife | Admitting: Advanced Practice Midwife

## 2016-06-18 DIAGNOSIS — Z1231 Encounter for screening mammogram for malignant neoplasm of breast: Secondary | ICD-10-CM

## 2016-07-15 DIAGNOSIS — K641 Second degree hemorrhoids: Secondary | ICD-10-CM | POA: Diagnosis not present

## 2016-08-03 DIAGNOSIS — Z0000 Encounter for general adult medical examination without abnormal findings: Secondary | ICD-10-CM | POA: Diagnosis not present

## 2016-08-03 DIAGNOSIS — E782 Mixed hyperlipidemia: Secondary | ICD-10-CM | POA: Diagnosis not present

## 2016-08-03 DIAGNOSIS — G43909 Migraine, unspecified, not intractable, without status migrainosus: Secondary | ICD-10-CM | POA: Diagnosis not present

## 2016-08-03 DIAGNOSIS — I351 Nonrheumatic aortic (valve) insufficiency: Secondary | ICD-10-CM | POA: Diagnosis not present

## 2016-08-03 DIAGNOSIS — E039 Hypothyroidism, unspecified: Secondary | ICD-10-CM | POA: Diagnosis not present

## 2016-08-03 DIAGNOSIS — I119 Hypertensive heart disease without heart failure: Secondary | ICD-10-CM | POA: Diagnosis not present

## 2016-08-03 DIAGNOSIS — F411 Generalized anxiety disorder: Secondary | ICD-10-CM | POA: Diagnosis not present

## 2016-08-03 DIAGNOSIS — M810 Age-related osteoporosis without current pathological fracture: Secondary | ICD-10-CM | POA: Diagnosis not present

## 2016-08-26 DIAGNOSIS — H17821 Peripheral opacity of cornea, right eye: Secondary | ICD-10-CM | POA: Diagnosis not present

## 2016-08-26 DIAGNOSIS — Z961 Presence of intraocular lens: Secondary | ICD-10-CM | POA: Diagnosis not present

## 2016-08-26 DIAGNOSIS — H10413 Chronic giant papillary conjunctivitis, bilateral: Secondary | ICD-10-CM | POA: Diagnosis not present

## 2016-08-26 DIAGNOSIS — H04123 Dry eye syndrome of bilateral lacrimal glands: Secondary | ICD-10-CM | POA: Diagnosis not present

## 2016-09-11 DIAGNOSIS — Z23 Encounter for immunization: Secondary | ICD-10-CM | POA: Diagnosis not present

## 2016-11-26 DIAGNOSIS — E039 Hypothyroidism, unspecified: Secondary | ICD-10-CM | POA: Diagnosis not present

## 2017-01-18 ENCOUNTER — Encounter: Payer: Self-pay | Admitting: Nurse Practitioner

## 2017-01-18 ENCOUNTER — Ambulatory Visit (INDEPENDENT_AMBULATORY_CARE_PROVIDER_SITE_OTHER): Payer: Medicare Other | Admitting: Nurse Practitioner

## 2017-01-18 VITALS — BP 144/86 | HR 64 | Ht 62.0 in | Wt 151.0 lb

## 2017-01-18 DIAGNOSIS — R350 Frequency of micturition: Secondary | ICD-10-CM | POA: Diagnosis not present

## 2017-01-18 DIAGNOSIS — R300 Dysuria: Secondary | ICD-10-CM | POA: Diagnosis not present

## 2017-01-18 DIAGNOSIS — R3915 Urgency of urination: Secondary | ICD-10-CM

## 2017-01-18 LAB — POCT URINALYSIS DIPSTICK
Bilirubin, UA: NEGATIVE
Glucose, UA: NEGATIVE
KETONES UA: NEGATIVE
NITRITE UA: POSITIVE
PH UA: 6
PROTEIN UA: NEGATIVE
Urobilinogen, UA: NEGATIVE

## 2017-01-18 MED ORDER — NITROFURANTOIN MONOHYD MACRO 100 MG PO CAPS: 100.0000 mg | ORAL_CAPSULE | Freq: Two times a day (BID) | ORAL | 0 refills | Status: DC

## 2017-01-18 NOTE — Progress Notes (Signed)
Patient ID: Rose Garza, female   DOB: [birth year], 71 y.o.   MRN: [id number]  70 y.o.Widowed Caucasian female G2P2002 here with complaint of UTI, with onset on 01/15/17. Patient complaining of:  dysuria, urinary frequency, urinary urgency and urinary odor. Patient denies fever, chills, nausea or back pain. No new personal products.  Denies vaginal symptoms.   Menopausal with vaginal dryness. Patient has adequate water intake   O: Healthy female WDWN Affect: Normal, orientation x 3 Skin : warm and dry CVAT: negative bilateral Abdomen: negative for suprapubic tenderness   POCT:  2+ leuk's, + nitrate, moderate RBC  A:  R/O UTI with history of same   P: Reviewed findings of UTI and need for treatment. Rx:  Macrobid 100 mg BID to pharmacy [id number] micro, culture Reviewed warning signs and symptoms of UTI and need to advise if occurring. Encouraged to limit soda, tea, and coffee   RV prn

## 2017-01-18 NOTE — Patient Instructions (Addendum)

## 2017-01-19 LAB — URINALYSIS, MICROSCOPIC ONLY
CASTS: NONE SEEN [LPF]
Crystals: NONE SEEN [HPF]
RBC / HPF: NONE SEEN RBC/HPF (ref <=2)
SQUAMOUS EPITHELIAL / LPF: NONE SEEN [HPF] (ref <=5)
WBC, UA: >60 WBC/HPF — AB (ref <=5)
YEAST: NONE SEEN [HPF]

## 2017-01-20 LAB — URINE CULTURE

## 2017-01-20 NOTE — Progress Notes (Signed)
Encounter reviewed by Dr. Brook Amundson C. Silva.  

## 2017-01-21 ENCOUNTER — Telehealth: Payer: Self-pay | Admitting: *Deleted

## 2017-01-21 NOTE — Telephone Encounter (Signed)
I have attempted to contact this patient by phone with the following results: left message to return call to [place] at [phone] answering machine (home per [facility]). Advised call was regarding recent labs.  [phone] (Home) *Preferred*

## 2017-01-21 NOTE — Telephone Encounter (Signed)
-----   Message from [place], [place] sent at 01/20/2017  3:57 PM EST ----- Please let pt know that urine culture was positive for infection and she is on the correct medication.

## 2017-01-25 NOTE — Telephone Encounter (Signed)
I have attempted to contact this patient by phone with the following results: left message to return call to [place] at [phone] answering machine (home per [facility]). Advised call was regarding recent labs. [phone] (Home) *Preferred*

## 2017-01-26 NOTE — Telephone Encounter (Signed)
Pt notified in result note.  Closing encounter. 

## 2017-02-15 DIAGNOSIS — N3000 Acute cystitis without hematuria: Secondary | ICD-10-CM | POA: Diagnosis not present

## 2017-02-23 DIAGNOSIS — N3090 Cystitis, unspecified without hematuria: Secondary | ICD-10-CM | POA: Diagnosis not present

## 2017-02-23 DIAGNOSIS — R300 Dysuria: Secondary | ICD-10-CM | POA: Diagnosis not present

## 2017-03-09 DIAGNOSIS — N3020 Other chronic cystitis without hematuria: Secondary | ICD-10-CM | POA: Diagnosis not present

## 2017-03-15 DIAGNOSIS — I10 Essential (primary) hypertension: Secondary | ICD-10-CM | POA: Insufficient documentation

## 2017-03-15 DIAGNOSIS — E039 Hypothyroidism, unspecified: Secondary | ICD-10-CM | POA: Insufficient documentation

## 2017-03-15 DIAGNOSIS — M810 Age-related osteoporosis without current pathological fracture: Secondary | ICD-10-CM | POA: Insufficient documentation

## 2017-03-15 DIAGNOSIS — E785 Hyperlipidemia, unspecified: Secondary | ICD-10-CM | POA: Insufficient documentation

## 2017-04-23 ENCOUNTER — Telehealth: Payer: Self-pay | Admitting: Certified Nurse Midwife

## 2017-04-23 NOTE — Telephone Encounter (Signed)
Left message to call and reschedule cancelled appointment.

## 2017-05-10 DIAGNOSIS — Z85828 Personal history of other malignant neoplasm of skin: Secondary | ICD-10-CM | POA: Diagnosis not present

## 2017-05-10 DIAGNOSIS — L814 Other melanin hyperpigmentation: Secondary | ICD-10-CM | POA: Diagnosis not present

## 2017-05-10 DIAGNOSIS — L821 Other seborrheic keratosis: Secondary | ICD-10-CM | POA: Diagnosis not present

## 2017-05-10 DIAGNOSIS — D225 Melanocytic nevi of trunk: Secondary | ICD-10-CM | POA: Diagnosis not present

## 2017-05-13 ENCOUNTER — Other Ambulatory Visit: Payer: Self-pay | Admitting: Advanced Practice Midwife

## 2017-05-13 DIAGNOSIS — Z1231 Encounter for screening mammogram for malignant neoplasm of breast: Secondary | ICD-10-CM

## 2017-06-08 ENCOUNTER — Ambulatory Visit: Payer: Managed Care, Other (non HMO) | Admitting: Certified Nurse Midwife

## 2017-06-17 ENCOUNTER — Ambulatory Visit (INDEPENDENT_AMBULATORY_CARE_PROVIDER_SITE_OTHER): Payer: Medicare Other | Admitting: Certified Nurse Midwife

## 2017-06-17 ENCOUNTER — Encounter: Payer: Self-pay | Admitting: Certified Nurse Midwife

## 2017-06-17 VITALS — BP 120/76 | HR 68 | Resp 16 | Ht 62.0 in | Wt 153.0 lb

## 2017-06-17 DIAGNOSIS — Z01419 Encounter for gynecological examination (general) (routine) without abnormal findings: Secondary | ICD-10-CM

## 2017-06-17 DIAGNOSIS — Z803 Family history of malignant neoplasm of breast: Secondary | ICD-10-CM | POA: Diagnosis not present

## 2017-06-17 DIAGNOSIS — N952 Postmenopausal atrophic vaginitis: Secondary | ICD-10-CM | POA: Diagnosis not present

## 2017-06-17 NOTE — Progress Notes (Signed)
71 y.o. G40P2002 Widowed  Caucasian Fe here for annual exam. Menopausal no HRT.Denies vaginal bleeding or vaginal dryness. Spouse died in past year, doing well emotionally. No UTI's in past 6 months! Saw urology, taking Macrobid daily now. Continues with Evista use without problems. Will need current mammogram to renew. Patient has scheduled. Sees PCP yearly for labs and aex, hypertension,hypothyroid,cholesterol and osteoporosis management. Planning some vacation for herself now. No other health issues today.  Patient's last menstrual period was 12/21/1986 (approximate).          Sexually active: No.  The current method of family planning is status post hysterectomy.    Exercising: Yes.    walking Smoker:  no  Health Maintenance: Pap:  02-18-2001 neg History of Abnormal Pap: no MMG:  06-18-16 category c density birads 1:neg, has scheduled Self Breast exams: yes Colonoscopy:  2017 polyps neg 22yrs BMD:   2016 osteoporosis TDaP:  2012 Shingles: 2014 Pneumonia: 2015 Hep C and HIV: both neg 2017 Labs: pcp   reports that she has never smoked. She has never used smokeless tobacco. She reports that she does not drink alcohol or use drugs.  Past Medical History:  Diagnosis Date  . Atrophic vaginitis   . Dyspareunia   . Fibroid   . Hematuria    negative workup  . Hypercholesteremia   . Hypertension   . Kidney stones   . Migraines   . MVP (mitral valve prolapse)   . Osteopenia of femoral neck 06/2015   left hip - 2.3.  on fosamax    Past Surgical History:  Procedure Laterality Date  . ABDOMINAL HYSTERECTOMY  04/1984    secondary to fibroids.  Ovaries remain  . ANKLE SURGERY Left 2010  . CESAREAN SECTION     times 2  . DILATION AND CURETTAGE OF UTERUS    . HEMORRHOID BANDING    . TEMPOROMANDIBULAR JOINT SURGERY  1985    Current Outpatient Prescriptions  Medication Sig Dispense Refill  . alendronate (FOSAMAX) 70 MG tablet Take 70 mg by mouth every 7 (seven) days. Take with a full  glass of water on an empty stomach.    Marland Kitchen atorvastatin (LIPITOR) 10 MG tablet Take 10 mg by mouth daily.    . Cholecalciferol (VITAMIN D PO) Take 1,000 Int'l Units by mouth daily.     . citalopram (CELEXA) 20 MG tablet Take 20 mg by mouth daily.    . nitrofurantoin, macrocrystal-monohydrate, (MACROBID) 100 MG capsule Take 1 capsule (100 mg total) by mouth 2 (two) times daily. (Patient taking differently: Take 100 mg by mouth daily. ) 14 capsule 0  . raloxifene (EVISTA) 60 MG tablet     . SYNTHROID 100 MCG tablet     . verapamil (CALAN-SR) 240 MG CR tablet      No current facility-administered medications for this visit.     Family History  Problem Relation Age of Onset  . Breast cancer Mother   . Heart disease Mother   . Osteoporosis Mother   . Breast cancer Maternal Aunt   . Breast cancer Maternal Aunt   . Breast cancer Maternal Aunt     ROS:  Pertinent items are noted in HPI.  Otherwise, a comprehensive ROS was negative.  Exam:   BP 120/76   Pulse 68   Resp 16   Ht 5\' 2"  (1.575 m)   Wt 153 lb (69.4 kg)   LMP 12/21/1986 (Approximate)   BMI 27.98 kg/m  Height: 5\' 2"  (157.5 cm) Ht  Readings from Last 3 Encounters:  06/17/17 5\' 2"  (1.575 m)  01/18/17 5\' 2"  (1.575 m)  06/02/16 5\' 2"  (1.575 m)    General appearance: alert, cooperative and appears stated age Head: Normocephalic, without obvious abnormality, atraumatic Neck: no adenopathy, supple, symmetrical, trachea midline and thyroid normal to inspection and palpation Lungs: clear to auscultation bilaterally Breasts: normal appearance, no masses or tenderness, No nipple retraction or dimpling, No nipple discharge or bleeding, No axillary or supraclavicular adenopathy Heart: regular rate and rhythm Abdomen: soft, non-tender; no masses,  no organomegaly Extremities: extremities normal, atraumatic, no cyanosis or edema Skin: Skin color, texture, turgor normal. No rashes or lesions Lymph nodes: Cervical, supraclavicular, and  axillary nodes normal. No abnormal inguinal nodes palpated Neurologic: Grossly normal   Pelvic: External genitalia:  no lesions              Urethra:  normal appearing urethra with no masses, tenderness or lesions              Bartholin's and Skene's: normal                 Vagina: normal appearing vagina with normal color and discharge, no lesions              Cervix: absent              Pap taken: No. Bimanual Exam:  Uterus:  uterus absent              Adnexa: normal adnexa and no mass, fullness, tenderness               Rectovaginal: Confirms               Anus:  normal sphincter tone, no lesions  Chaperone present: yes  A:  Well Woman with normal exam  Menopausal no HRT  Chronic history of UTI, now on Macrobid with no UTI.  Family history of breast cancer, mother and maternal aunts x3  Hypertension, cholesterol,hypothyroid and osteoporosis management with PCP all stable per patient    P:   Reviewed health and wellness pertinent to exam  Aware of need to advise if vaginal bleeding.  Continue Macrobid as directed daily to prevent UTI, advise if change with PCP.   Will need current mammogram for Evista continuance, discussed risks and benefits, patient would like to continue.  Continue follow up as indicated with medical problems with PCP  Pap smear: no   counseled on breast self exam, mammography screening, feminine hygiene, adequate intake of calcium and vitamin D, diet and exercise  return annually or prn  An After Visit Summary was printed and given to the patient.

## 2017-06-17 NOTE — Patient Instructions (Signed)

## 2017-06-24 ENCOUNTER — Ambulatory Visit: Payer: 59

## 2017-06-28 ENCOUNTER — Ambulatory Visit
Admission: RE | Admit: 2017-06-28 | Discharge: 2017-06-28 | Disposition: A | Discharge status: Home / Self Care | Payer: Medicare Other | Source: Ambulatory Visit | Attending: Advanced Practice Midwife | Admitting: Advanced Practice Midwife

## 2017-06-28 DIAGNOSIS — Z1231 Encounter for screening mammogram for malignant neoplasm of breast: Secondary | ICD-10-CM | POA: Diagnosis not present

## 2017-08-18 ENCOUNTER — Other Ambulatory Visit: Payer: Self-pay | Admitting: Family Medicine

## 2017-08-18 DIAGNOSIS — Z1159 Encounter for screening for other viral diseases: Secondary | ICD-10-CM | POA: Diagnosis not present

## 2017-08-18 DIAGNOSIS — M810 Age-related osteoporosis without current pathological fracture: Secondary | ICD-10-CM

## 2017-08-18 DIAGNOSIS — E039 Hypothyroidism, unspecified: Secondary | ICD-10-CM | POA: Diagnosis not present

## 2017-08-18 DIAGNOSIS — F411 Generalized anxiety disorder: Secondary | ICD-10-CM | POA: Diagnosis not present

## 2017-08-18 DIAGNOSIS — I119 Hypertensive heart disease without heart failure: Secondary | ICD-10-CM | POA: Diagnosis not present

## 2017-08-18 DIAGNOSIS — Z0000 Encounter for general adult medical examination without abnormal findings: Secondary | ICD-10-CM | POA: Diagnosis not present

## 2017-08-26 DIAGNOSIS — M545 Low back pain: Secondary | ICD-10-CM | POA: Diagnosis not present

## 2017-08-30 DIAGNOSIS — M545 Low back pain: Secondary | ICD-10-CM | POA: Diagnosis not present

## 2017-09-01 DIAGNOSIS — M545 Low back pain: Secondary | ICD-10-CM | POA: Diagnosis not present

## 2017-09-02 ENCOUNTER — Ambulatory Visit
Admission: RE | Admit: 2017-09-02 | Discharge: 2017-09-02 | Disposition: A | Discharge status: Home / Self Care | Payer: Medicare Other | Source: Ambulatory Visit | Attending: Family Medicine | Admitting: Family Medicine

## 2017-09-02 DIAGNOSIS — Z780 Asymptomatic menopausal state: Secondary | ICD-10-CM | POA: Diagnosis not present

## 2017-09-02 DIAGNOSIS — M810 Age-related osteoporosis without current pathological fracture: Secondary | ICD-10-CM

## 2017-09-02 DIAGNOSIS — M85852 Other specified disorders of bone density and structure, left thigh: Secondary | ICD-10-CM | POA: Diagnosis not present

## 2017-09-07 DIAGNOSIS — M545 Low back pain: Secondary | ICD-10-CM | POA: Diagnosis not present

## 2017-09-13 DIAGNOSIS — Z23 Encounter for immunization: Secondary | ICD-10-CM | POA: Diagnosis not present

## 2017-10-25 DIAGNOSIS — J069 Acute upper respiratory infection, unspecified: Secondary | ICD-10-CM | POA: Diagnosis not present

## 2018-02-18 DIAGNOSIS — E782 Mixed hyperlipidemia: Secondary | ICD-10-CM | POA: Diagnosis not present

## 2018-02-18 DIAGNOSIS — I119 Hypertensive heart disease without heart failure: Secondary | ICD-10-CM | POA: Diagnosis not present

## 2018-02-18 DIAGNOSIS — E039 Hypothyroidism, unspecified: Secondary | ICD-10-CM | POA: Diagnosis not present

## 2018-05-10 DIAGNOSIS — D2261 Melanocytic nevi of right upper limb, including shoulder: Secondary | ICD-10-CM | POA: Diagnosis not present

## 2018-05-10 DIAGNOSIS — Z85828 Personal history of other malignant neoplasm of skin: Secondary | ICD-10-CM | POA: Diagnosis not present

## 2018-05-10 DIAGNOSIS — L84 Corns and callosities: Secondary | ICD-10-CM | POA: Diagnosis not present

## 2018-05-10 DIAGNOSIS — D2271 Melanocytic nevi of right lower limb, including hip: Secondary | ICD-10-CM | POA: Diagnosis not present

## 2018-05-21 DIAGNOSIS — N3001 Acute cystitis with hematuria: Secondary | ICD-10-CM | POA: Diagnosis not present

## 2018-05-21 DIAGNOSIS — N3000 Acute cystitis without hematuria: Secondary | ICD-10-CM | POA: Diagnosis not present

## 2018-05-21 DIAGNOSIS — R350 Frequency of micturition: Secondary | ICD-10-CM | POA: Diagnosis not present

## 2018-06-10 ENCOUNTER — Other Ambulatory Visit: Payer: Self-pay | Admitting: Advanced Practice Midwife

## 2018-06-10 DIAGNOSIS — Z1231 Encounter for screening mammogram for malignant neoplasm of breast: Secondary | ICD-10-CM

## 2018-06-21 ENCOUNTER — Ambulatory Visit: Payer: Medicare Other | Admitting: Certified Nurse Midwife

## 2018-07-01 ENCOUNTER — Ambulatory Visit: Payer: Medicare Other

## 2018-07-05 ENCOUNTER — Ambulatory Visit: Payer: Medicare Other | Admitting: Certified Nurse Midwife

## 2018-07-05 ENCOUNTER — Other Ambulatory Visit: Payer: Self-pay

## 2018-07-05 ENCOUNTER — Encounter: Payer: Self-pay | Admitting: Certified Nurse Midwife

## 2018-07-05 VITALS — BP 136/68 | HR 66 | Temp 97.7°F | Resp 14 | Ht 61.5 in | Wt 149.5 lb

## 2018-07-05 DIAGNOSIS — N3001 Acute cystitis with hematuria: Secondary | ICD-10-CM

## 2018-07-05 DIAGNOSIS — Z0000 Encounter for general adult medical examination without abnormal findings: Secondary | ICD-10-CM

## 2018-07-05 DIAGNOSIS — Z01411 Encounter for gynecological examination (general) (routine) with abnormal findings: Secondary | ICD-10-CM | POA: Diagnosis not present

## 2018-07-05 DIAGNOSIS — Z803 Family history of malignant neoplasm of breast: Secondary | ICD-10-CM

## 2018-07-05 DIAGNOSIS — N952 Postmenopausal atrophic vaginitis: Secondary | ICD-10-CM | POA: Diagnosis not present

## 2018-07-05 LAB — POCT URINALYSIS DIPSTICK
Bilirubin, UA: NEGATIVE
Glucose, UA: NEGATIVE
KETONES UA: NEGATIVE
NITRITE UA: POSITIVE
PH UA: 5 (ref 5.0–8.0)
PROTEIN UA: NEGATIVE
UROBILINOGEN UA: 0.2 U/dL

## 2018-07-05 MED ORDER — NITROFURANTOIN MONOHYD MACRO 100 MG PO CAPS: ORAL_CAPSULE | ORAL | 0 refills | Status: DC

## 2018-07-05 NOTE — Progress Notes (Signed)
72 y.o. G38P2002 Widowed  Caucasian Fe here for annual exam. Patient complaining of urinary frequency and urgency with occasional burning. No blood noted by patient. Denies fever, chills, back ache or nausea. Last UTI treated with Macrobid twice daily for 7 days and cleared, which was in the past two months..Patient stopped all caffeine, drinking water mainly. Denies vaginal bleeding, but is having vaginal dryness. Sees Dr. Brigitte Pulse once yearly for medication management of cholesterol, anxiety, Hypothyroid,, BMD with [facility]. Sees Dr Tamala Julian for heart murmur. No other health issues today.  Patient's last menstrual period was 12/21/1986 (approximate).          Sexually active: No.  The current method of family planning is status post hysterectomy.    Exercising: No.  The patient does not participate in regular exercise at present. Smoker:  no  Health Maintenance: Pap:  02-18-01 negative  TAH history History of Abnormal Pap: no MMG:  06-28-17 BIRADS 1 negative, scheduled 07-15-18  Self Breast exams: yes Colonoscopy: 2017 polyps, repeat 5 years  BMD:   2018 osteopenia  TDaP:  2012  Shingles: 2019  Pneumonia: 2014  Hep C and HIV: 06-02-16 both negative  Labs: poct urine- positive nitrite, large WBC, moderate RBC   reports that she has never smoked. She has never used smokeless tobacco. She reports that she does not drink alcohol or use drugs.  Past Medical History:  Diagnosis Date  . Atrophic vaginitis   . Dyspareunia   . Fibroid   . Hematuria    negative workup  . Hypercholesteremia   . Hypertension   . Kidney stones   . Migraines   . MVP (mitral valve prolapse)   . Osteopenia of femoral neck 06/2015   left hip - 2.3.  on fosamax    Past Surgical History:  Procedure Laterality Date  . ABDOMINAL HYSTERECTOMY  04/1984    secondary to fibroids.  Ovaries remain  . ANKLE SURGERY Left 2010  . CESAREAN SECTION     times 2  . DILATION AND CURETTAGE OF UTERUS    . HEMORRHOID BANDING    .  TEMPOROMANDIBULAR JOINT SURGERY  1985    Current Outpatient Medications  Medication Sig Dispense Refill  . atorvastatin (LIPITOR) 10 MG tablet Take 10 mg by mouth daily.    . Cholecalciferol (VITAMIN D PO) Take 1,000 Int'l Units by mouth daily.     . citalopram (CELEXA) 20 MG tablet Take 20 mg by mouth daily.    Marland Kitchen SYNTHROID 100 MCG tablet     . verapamil (CALAN-SR) 240 MG CR tablet     . raloxifene (EVISTA) 60 MG tablet      No current facility-administered medications for this visit.     Family History  Problem Relation Age of Onset  . Breast cancer Mother 87  . Heart disease Mother   . Osteoporosis Mother   . Breast cancer Maternal Aunt   . Breast cancer Maternal Aunt   . Breast cancer Maternal Aunt     ROS:  Pertinent items are noted in HPI.  Otherwise, a comprehensive ROS was negative.  Exam:   BP 136/68 (BP Location: Right Arm, Patient Position: Sitting, Cuff Size: Normal)   Pulse 66   Resp 14   Ht 5' 1.5" (1.562 m)   Wt 149 lb 8 oz (67.8 kg)   LMP 12/21/1986 (Approximate)   BMI 27.79 kg/m  Height: 5' 1.5" (156.2 cm) Ht Readings from Last 3 Encounters:  07/05/18 5' 1.5" (1.562 m)  06/17/17 5\' 2"  (1.575 m)  01/18/17 5\' 2"  (1.575 m)    General appearance: alert, cooperative and appears stated age Head: Normocephalic, without obvious abnormality, atraumatic Neck: no adenopathy, supple, symmetrical, trachea midline and thyroid normal to inspection and palpation Lungs: clear to auscultation bilaterally CVAT : negative bilateral Breasts: normal appearance, no masses or tenderness, No nipple retraction or dimpling, No nipple discharge or bleeding, No axillary or supraclavicular adenopathy Heart: regular rate and rhythm Abdomen: soft, non-tender; no masses,  no organomegaly, positive suprapubic Extremities: extremities normal, atraumatic, no cyanosis or edema Skin: Skin color, texture, turgor normal. No rashes or lesions, warm and dry Lymph nodes: Cervical,  supraclavicular, and axillary nodes normal. No abnormal inguinal nodes palpated Neurologic: Grossly normal   Pelvic: External genitalia:  no lesions, atrophic appearance              Urethra:  normal appearing urethra with no masses or lesions, but tenderness noted  Urethral meatus prominent, red, tender, dryness noted in surrounding tissue  Bladder: slight tenderness              Bartholin's and Skene's: normal                 Vagina: atrophic appearing vagina with normal color and discharge, no lesions              Cervix: absent              Pap taken: No. Bimanual Exam:  Uterus:  uterus absent              Adnexa: normal adnexa and no mass, fullness, tenderness               Rectovaginal: Confirms               Anus:  normal sphincter tone, no lesions, small,soft non thrombosed hemorrhoid, non tender, no redness  Chaperone present: yes  A:  Well Woman with normal exam  Post menopausal  S/p TAH for fibroid, with ovaries retained  UTI  Atrophic vaginitis   Cholesterol, hypertension,MVP, Migraines with PCP management  Family history of breast cancer, declines any genetic screening at this age    P:   Reviewed health and wellness pertinent to exam  Discussed UTI findings and etiology. Discussed need for treatment with Rx. Discussed warning signs and need to advise if occurs. Discussed vaginal dryness can increase risk of UTI. Also possible Estrogen use around urethral meatus to help reduce occurrence. Patient declines any Estrogen use due family history of breast cancer.  Rx Macrobid see order with instructions  Lab: Urine micro/culture  Discussed treatment with coconut oil or Olive oil nightly for vaginal dryness or with OTC options. Instructions given and patient plans to start soon.  Continue follow up with MD as indicated  Pap smear: no   counseled on breast self exam, mammography screening, feminine hygiene, adequate intake of calcium and vitamin D, diet and exercise, Kegel's  exercises  return annually or prn  An After Visit Summary was printed and given to the patient.

## 2018-07-05 NOTE — Patient Instructions (Signed)
EXERCISE AND DIET:  We recommended that you start or continue a regular exercise program for good health. Regular exercise means any activity that makes your heart beat faster and makes you sweat.  We recommend exercising at least 30 minutes per day at least 3 days a week, preferably 4 or 5.  We also recommend a diet low in fat and sugar.  Inactivity, poor dietary choices and obesity can cause diabetes, heart attack, stroke, and kidney damage, among others.    ALCOHOL AND SMOKING:  Women should limit their alcohol intake to no more than 7 drinks/beers/glasses of wine (combined, not each!) per week. Moderation of alcohol intake to this level decreases your risk of breast cancer and liver damage. And of course, no recreational drugs are part of a healthy lifestyle.  And absolutely no smoking or even second hand smoke. Most people know smoking can cause heart and lung diseases, but did you know it also contributes to weakening of your bones? Aging of your skin?  Yellowing of your teeth and nails?  CALCIUM AND VITAMIN D:  Adequate intake of calcium and Vitamin D are recommended.  The recommendations for exact amounts of these supplements seem to change often, but generally speaking 600 mg of calcium (either carbonate or citrate) and 800 units of Vitamin D per day seems prudent. Certain women may benefit from higher intake of Vitamin D.  If you are among these women, your doctor will have told you during your visit.    PAP SMEARS:  Pap smears, to check for cervical cancer or precancers,  have traditionally been done yearly, although recent scientific advances have shown that most women can have pap smears less often.  However, every woman still should have a physical exam from her gynecologist every year. It will include a breast check, inspection of the vulva and vagina to check for abnormal growths or skin changes, a visual exam of the cervix, and then an exam to evaluate the size and shape of the uterus and  ovaries.  And after 74 years of age, a rectal exam is indicated to check for rectal cancers. We will also provide age appropriate advice regarding health maintenance, like when you should have certain vaccines, screening for sexually transmitted diseases, bone density testing, colonoscopy, mammograms, etc.   MAMMOGRAMS:  All women over 40 years old should have a yearly mammogram. Many facilities now offer a "3D" mammogram, which may cost around $50 extra out of pocket. If possible,  we recommend you accept the option to have the 3D mammogram performed.  It both reduces the number of women who will be called back for extra views which then turn out to be normal, and it is better than the routine mammogram at detecting truly abnormal areas.    COLONOSCOPY:  Colonoscopy to screen for colon cancer is recommended for all women at age 50.  We know, you hate the idea of the prep.  We agree, BUT, having colon cancer and not knowing it is worse!!  Colon cancer so often starts as a polyp that can be seen and removed at colonscopy, which can quite literally save your life!  And if your first colonoscopy is normal and you have no family history of colon cancer, most women don't have to have it again for 10 years.  Once every ten years, you can do something that may end up saving your life, right?  We will be happy to help you get it scheduled when you are ready.    Be sure to check your insurance coverage so you understand how much it will cost.  It may be covered as a preventative service at no cost, but you should check your particular policy.      Urinary Tract Infection, Adult A urinary tract infection (UTI) is an infection of any part of the urinary tract. The urinary tract includes the:  Kidneys.  Ureters.  Bladder.  Urethra.  These organs make, store, and get rid of pee (urine) in the body. Follow these instructions at home:  Take over-the-counter and prescription medicines only as told by your  doctor.  If you were prescribed an antibiotic medicine, take it as told by your doctor. Do not stop taking the antibiotic even if you start to feel better.  Avoid the following drinks: ? Alcohol. ? Caffeine. ? Tea. ? Carbonated drinks.  Drink enough fluid to keep your pee clear or pale yellow.  Keep all follow-up visits as told by your doctor. This is important.  Make sure to: ? Empty your bladder often and completely. Do not to hold pee for long periods of time. ? Empty your bladder before and after sex. ? Wipe from front to back after a bowel movement if you are female. Use each tissue one time when you wipe. Contact a doctor if:  You have back pain.  You have a fever.  You feel sick to your stomach (nauseous).  You throw up (vomit).  Your symptoms do not get better after 3 days.  Your symptoms go away and then come back. Get help right away if:  You have very bad back pain.  You have very bad lower belly (abdominal) pain.  You are throwing up and cannot keep down any medicines or water. This information is not intended to replace advice given to you by your health care provider. Make sure you discuss any questions you have with your health care provider. Document Released: 05/25/2008 Document Revised: 05/14/2016 Document Reviewed: 10/28/2015 Elsevier Interactive Patient Education  2018 [place].  Atrophic Vaginitis Atrophic vaginitis is when the tissues that line the vagina become dry and thin. This is caused by a drop in estrogen. Estrogen helps:  To keep the vagina moist.  To make a clear fluid that helps: ? To lubricate the vagina for sex. ? To protect the vagina from infection.  If the lining of the vagina is dry and thin, it may:  Make sex painful. It may also cause bleeding.  Cause a feeling of: ? Burning. ? Irritation. ? Itchiness.  Make an exam of your vagina painful. It may also cause bleeding.  Make you lose interest in sex.  Cause a  burning feeling when you pee.  Make your vaginal fluid (discharge) brown or yellow.  For some women, there are no symptoms. This condition is most common in women who do not get their regular menstrual periods anymore (menopause). This often starts when a woman is 40-11 years old. Follow these instructions at home:  Take medicines only as told by your doctor. Do not use any herbal or alternative medicines unless your doctor says it is okay.  Use over-the-counter products for dryness only as told by your doctor. These include: ? Creams. ? Lubricants. ? Moisturizers.  Do not douche.  Do not use products that can make your vagina dry. These include: ? Scented feminine sprays. ? Scented tampons. ? Scented soaps.  If it hurts to have sex, tell your sexual partner. Contact a doctor if:  Your discharge looks different than normal.  Your vagina has an unusual smell.  You have new symptoms.  Your symptoms do not get better with treatment.  Your symptoms get worse. This information is not intended to replace advice given to you by your health care provider. Make sure you discuss any questions you have with your health care provider. Document Released: 05/25/2008 Document Revised: 05/14/2016 Document Reviewed: 11/28/2014 Elsevier Interactive Patient Education  [employer].

## 2018-07-06 DIAGNOSIS — H10413 Chronic giant papillary conjunctivitis, bilateral: Secondary | ICD-10-CM | POA: Diagnosis not present

## 2018-07-06 DIAGNOSIS — Z961 Presence of intraocular lens: Secondary | ICD-10-CM | POA: Diagnosis not present

## 2018-07-06 DIAGNOSIS — H04123 Dry eye syndrome of bilateral lacrimal glands: Secondary | ICD-10-CM | POA: Diagnosis not present

## 2018-07-06 DIAGNOSIS — H17821 Peripheral opacity of cornea, right eye: Secondary | ICD-10-CM | POA: Diagnosis not present

## 2018-07-06 LAB — URINALYSIS, MICROSCOPIC ONLY
RBC, UA: >30 /hpf — AB (ref 0–2)
WBC, UA: >30 /hpf — AB (ref 0–5)

## 2018-07-08 LAB — URINE CULTURE

## 2018-07-15 ENCOUNTER — Ambulatory Visit
Admission: RE | Admit: 2018-07-15 | Discharge: 2018-07-15 | Disposition: A | Discharge status: Home / Self Care | Payer: Medicare Other | Source: Ambulatory Visit | Attending: Advanced Practice Midwife | Admitting: Advanced Practice Midwife

## 2018-07-15 DIAGNOSIS — Z1231 Encounter for screening mammogram for malignant neoplasm of breast: Secondary | ICD-10-CM | POA: Diagnosis not present

## 2018-07-28 ENCOUNTER — Encounter: Payer: Self-pay | Admitting: Certified Nurse Midwife

## 2018-07-28 ENCOUNTER — Ambulatory Visit (INDEPENDENT_AMBULATORY_CARE_PROVIDER_SITE_OTHER): Payer: Medicare Other | Admitting: Certified Nurse Midwife

## 2018-07-28 VITALS — BP 120/70 | HR 68 | Resp 16 | Ht 61.5 in | Wt 150.0 lb

## 2018-07-28 DIAGNOSIS — N952 Postmenopausal atrophic vaginitis: Secondary | ICD-10-CM | POA: Diagnosis not present

## 2018-07-28 DIAGNOSIS — Z87440 Personal history of urinary (tract) infections: Secondary | ICD-10-CM | POA: Diagnosis not present

## 2018-07-28 LAB — POCT URINALYSIS DIPSTICK
BILIRUBIN UA: NEGATIVE
Blood, UA: NEGATIVE
Glucose, UA: NEGATIVE
Ketones, UA: NEGATIVE
Leukocytes, UA: NEGATIVE
Nitrite, UA: NEGATIVE
Protein, UA: NEGATIVE
Urobilinogen, UA: NEGATIVE E.U./dL — AB
pH, UA: 5 (ref 5.0–8.0)

## 2018-07-28 MED ORDER — NITROFURANTOIN MONOHYD MACRO 100 MG PO CAPS: 100.0000 mg | ORAL_CAPSULE | Freq: Two times a day (BID) | ORAL | 0 refills | Status: DC

## 2018-07-28 NOTE — Progress Notes (Signed)
Review of Systems  Constitutional: Negative.   HENT: Negative.   Eyes: Negative.   Respiratory: Negative.   Cardiovascular: Negative.   Gastrointestinal: Negative.   Genitourinary: Negative.   Musculoskeletal: Negative.   Skin: Negative.   Neurological: Negative.   Endo/Heme/Allergies: Negative.   Psychiatric/Behavioral: Negative.     72 y.o. widowed Caucasian female G2P2002 here for follow up of uti/vaginal dryness treated with macrobid & use of coconut oil initiated on 07/05/18. Completed Macrobid  as directed.  Denies any symptoms of urinary frequency, urgency,leaking or pain. Patient is taking cranberry tablets twice daily, has stopped caffeine use, using coconut oil nightly with no problems. She is making sure she is voiding completely each time. "feel like that was part of the problem". Denies any vaginal itching burning or vaginal bleeding. Will be traveling for 10 days and on airline for 18-24 hours soon, worried if she has another UTI. No other health concerns today.    O: Healthy WD,WN female Affect: normal Skin:warm and dry CVAT: negative bilateral Abdomen:soft, non tender, negative suprapubic  Pelvic exam:EXTERNAL GENITALIA: atrophic appearing vulva with no masses, tenderness or lesions,  Bladder, urethra, urethral meatus non tender VAGINA: no abnormal discharge or lesions and atrophic appearance, but good moisture noted CERVIX: no lesions or cervical motion tenderness and absent UTERUS: absent ADNEXA: no masses palpable and nontender RECTUM: no irritation noted in area  A:UTI Resolved  Atrophic vaginitis responding well to coconut oil use POCT Urine: negative  P: Discussed findings of normal pelvic exam. Discussed no evidence of UTI now and using vaginal moisture is helping with prevention.Continue her routine now with cranberry tablets, coconut oil for vaginal moisture, emptying bladder well. Discussed will give Rx of Macrobid take with her on trip, if symptoms  occur. Stressed not sitting for long periods of time if possible. Making sure she drinks adequate water daily. Warning signs of UTI given. Questions addressed.  Rx Macrobid see order with instructions  Rv prn

## 2018-07-28 NOTE — Patient Instructions (Signed)

## 2018-09-07 DIAGNOSIS — Z23 Encounter for immunization: Secondary | ICD-10-CM | POA: Diagnosis not present

## 2018-09-08 DIAGNOSIS — N390 Urinary tract infection, site not specified: Secondary | ICD-10-CM | POA: Diagnosis not present

## 2018-09-08 DIAGNOSIS — R3915 Urgency of urination: Secondary | ICD-10-CM | POA: Diagnosis not present

## 2018-10-30 DIAGNOSIS — J069 Acute upper respiratory infection, unspecified: Secondary | ICD-10-CM | POA: Diagnosis not present

## 2018-11-04 DIAGNOSIS — I119 Hypertensive heart disease without heart failure: Secondary | ICD-10-CM | POA: Diagnosis not present

## 2018-11-04 DIAGNOSIS — J069 Acute upper respiratory infection, unspecified: Secondary | ICD-10-CM | POA: Diagnosis not present

## 2018-11-04 DIAGNOSIS — E039 Hypothyroidism, unspecified: Secondary | ICD-10-CM | POA: Diagnosis not present

## 2018-11-04 DIAGNOSIS — Z0000 Encounter for general adult medical examination without abnormal findings: Secondary | ICD-10-CM | POA: Diagnosis not present

## 2019-01-13 ENCOUNTER — Other Ambulatory Visit (HOSPITAL_COMMUNITY): Payer: Self-pay | Admitting: *Deleted

## 2019-01-16 ENCOUNTER — Ambulatory Visit (HOSPITAL_COMMUNITY)
Admission: RE | Admit: 2019-01-16 | Discharge: 2019-01-16 | Disposition: A | Discharge status: Home / Self Care | Payer: Medicare Other | Source: Ambulatory Visit | Attending: Internal Medicine | Admitting: Internal Medicine

## 2019-01-16 LAB — HEMOGLOBIN AND HEMATOCRIT, BLOOD
HCT: 45.4 % (ref 36.0–46.0)
Hemoglobin: 15.1 g/dL — ABNORMAL HIGH (ref 12.0–15.0)

## 2019-01-16 MED ORDER — LIDOCAINE HCL (PF) 1 % IJ SOLN: 2.0000 mL | Freq: Once | INTRAMUSCULAR | Status: DC

## 2019-01-16 MED ORDER — LIDOCAINE HCL (PF) 1 % IJ SOLN
INTRAMUSCULAR | Status: AC
  Administered 2019-01-16: 09:00:00
  Filled 2019-01-16: qty 2

## 2019-05-05 DIAGNOSIS — E039 Hypothyroidism, unspecified: Secondary | ICD-10-CM | POA: Diagnosis not present

## 2019-05-05 DIAGNOSIS — I119 Hypertensive heart disease without heart failure: Secondary | ICD-10-CM | POA: Diagnosis not present

## 2019-05-05 DIAGNOSIS — E782 Mixed hyperlipidemia: Secondary | ICD-10-CM | POA: Diagnosis not present

## 2019-05-05 DIAGNOSIS — F411 Generalized anxiety disorder: Secondary | ICD-10-CM | POA: Diagnosis not present

## 2019-05-11 DIAGNOSIS — L821 Other seborrheic keratosis: Secondary | ICD-10-CM | POA: Diagnosis not present

## 2019-05-11 DIAGNOSIS — D2261 Melanocytic nevi of right upper limb, including shoulder: Secondary | ICD-10-CM | POA: Diagnosis not present

## 2019-05-11 DIAGNOSIS — D1801 Hemangioma of skin and subcutaneous tissue: Secondary | ICD-10-CM | POA: Diagnosis not present

## 2019-05-11 DIAGNOSIS — Z85828 Personal history of other malignant neoplasm of skin: Secondary | ICD-10-CM | POA: Diagnosis not present

## 2019-06-19 ENCOUNTER — Other Ambulatory Visit: Payer: Self-pay | Admitting: Family Medicine

## 2019-06-19 DIAGNOSIS — Z9289 Personal history of other medical treatment: Secondary | ICD-10-CM

## 2019-07-04 DIAGNOSIS — E039 Hypothyroidism, unspecified: Secondary | ICD-10-CM | POA: Diagnosis not present

## 2019-07-12 ENCOUNTER — Other Ambulatory Visit: Payer: Self-pay

## 2019-07-13 ENCOUNTER — Ambulatory Visit: Payer: Medicare Other | Admitting: Certified Nurse Midwife

## 2019-07-13 NOTE — Progress Notes (Signed)
73 y.o. G53P2002 Widowed  Caucasian Fe here for annual exam. Post menopausal, no vaginal dryness issues. Has seen PCP for labs, Hypothyroid, Hypertension and cholesterol,and Dr. Earlean Shawl for follow up. Taking Macrobid every other day to prevent UTI, per Dr. Brigitte Pulse.  Patient's last menstrual period was 12/21/1986 (approximate).          Sexually active: Yes.    The current method of family planning is status post hysterectomy.    Exercising: Yes.    walking Smoker:  no  Review of Systems  Constitutional: Negative.   HENT: Negative.   Eyes: Negative.   Respiratory: Negative.   Cardiovascular: Negative.   Gastrointestinal: Negative.   Genitourinary: Negative.   Musculoskeletal: Negative.   Skin: Negative.   Neurological: Negative.   Endo/Heme/Allergies: Negative.   Psychiatric/Behavioral: Negative.     Health Maintenance: Pap:  02-18-01 negative  History of Abnormal Pap: no MMG:  07-15-18 density C/BIRADS 1 negative  Self Breast exams: yes Colonoscopy:  2017 polyps, repeat 5 years  BMD:   2018 osteopenia  TDaP:  2012 Shingles: 2019 Pneumonia: 2015 Hep C and HIV: 2017 both negative  Labs: PCP   reports that she has never smoked. She has never used smokeless tobacco. She reports that she does not drink alcohol or use drugs.  Past Medical History:  Diagnosis Date  . Atrophic vaginitis   . Dyspareunia   . Fibroid   . Hematuria    negative workup  . Hypercholesteremia   . Hypertension   . Kidney stones   . Migraines   . MVP (mitral valve prolapse)   . Osteopenia of femoral neck 06/2015   left hip - 2.3.  on fosamax    Past Surgical History:  Procedure Laterality Date  . ABDOMINAL HYSTERECTOMY  04/1984    secondary to fibroids.  Ovaries remain  . ANKLE SURGERY Left 2010  . CESAREAN SECTION     times 2  . DILATION AND CURETTAGE OF UTERUS    . HEMORRHOID BANDING    . TEMPOROMANDIBULAR JOINT SURGERY  1985    Current Outpatient Medications  Medication Sig Dispense Refill   . atorvastatin (LIPITOR) 10 MG tablet Take 10 mg by mouth daily.    . Cholecalciferol (VITAMIN D PO) Take 1,000 Int'l Units by mouth daily.     . citalopram (CELEXA) 20 MG tablet Take 20 mg by mouth daily.    . nitrofurantoin, macrocrystal-monohydrate, (MACROBID) 100 MG capsule Take 1 capsule (100 mg total) by mouth 2 (two) times daily. Take one capsule BID x 10 days 20 capsule 0  . raloxifene (EVISTA) 60 MG tablet     . SYNTHROID 100 MCG tablet     . verapamil (CALAN-SR) 240 MG CR tablet      No current facility-administered medications for this visit.     Family History  Problem Relation Age of Onset  . Breast cancer Mother 38  . Heart disease Mother   . Osteoporosis Mother   . Breast cancer Maternal Aunt   . Breast cancer Maternal Aunt   . Breast cancer Maternal Aunt     ROS:  Pertinent items are noted in HPI.  Otherwise, a comprehensive ROS was negative.  Exam:   BP 112/70   Pulse 64   Temp (!) 97.3 F (36.3 C) (Skin)   Resp 16   Ht 5' 1.75" (1.568 m)   Wt 159 lb (72.1 kg)   LMP 12/21/1986 (Approximate)   BMI 29.32 kg/m  Height: 5' 1.75" (156.8  cm) Ht Readings from Last 3 Encounters:  07/14/19 5' 1.75" (1.568 m)  07/28/18 5' 1.5" (1.562 m)  07/05/18 5' 1.5" (1.562 m)    General appearance: alert, cooperative and appears stated age Head: Normocephalic, without obvious abnormality, atraumatic Neck: no adenopathy, supple, symmetrical, trachea midline and thyroid normal to inspection and palpation Lungs: clear to auscultation bilaterally Breasts: normal appearance, no masses or tenderness, No nipple retraction or dimpling, No nipple discharge or bleeding, No axillary or supraclavicular adenopathy Heart: regular rate and rhythm Abdomen: soft, non-tender; no masses,  no organomegaly Extremities: extremities normal, atraumatic, no cyanosis or edema Skin: Skin color, texture, turgor normal. No rashes or lesions Lymph nodes: Cervical, supraclavicular, and axillary nodes  normal. No abnormal inguinal nodes palpated Neurologic: Grossly normal   Pelvic: External genitalia:  no lesions              Urethra:  normal appearing urethra with no masses, tenderness or lesions              Bartholin's and Skene's: normal                 Vagina: normal appearing vagina with normal color and discharge, no lesions              Cervix: absent              Pap taken: No. Bimanual Exam:  Uterus:  uterus absent              Adnexa: no mass, fullness, tenderness               Rectovaginal: Confirms               Anus:  normal sphincter tone, no lesions  Chaperone present: yes  A:  Well Woman with normal exam  Post menopausal no HRT  Hypothyroid, Hypertension with PCP management  Mammogram and BMD due will schedule    P:   Reviewed health and wellness pertinent to exam  Aware of need to advise if vaginal bleeding, even she has had hysterectomy  Continue follow up with PCP as indicated  Order placed, patient will call to schedule  Pap smear: no   counseled on breast self exam, mammography screening, feminine hygiene, adequate intake of calcium and vitamin D, diet and exercise, Kegel's exercises  return annually or prn  An After Visit Summary was printed and given to the patient.

## 2019-07-14 ENCOUNTER — Encounter: Payer: Self-pay | Admitting: Certified Nurse Midwife

## 2019-07-14 ENCOUNTER — Other Ambulatory Visit: Payer: Self-pay

## 2019-07-14 ENCOUNTER — Ambulatory Visit (INDEPENDENT_AMBULATORY_CARE_PROVIDER_SITE_OTHER): Payer: Medicare Other | Admitting: Certified Nurse Midwife

## 2019-07-14 VITALS — BP 112/70 | HR 64 | Temp 97.3°F | Resp 16 | Ht 61.75 in | Wt 159.0 lb

## 2019-07-14 DIAGNOSIS — Z780 Asymptomatic menopausal state: Secondary | ICD-10-CM

## 2019-07-14 DIAGNOSIS — Z01419 Encounter for gynecological examination (general) (routine) without abnormal findings: Secondary | ICD-10-CM

## 2019-07-14 DIAGNOSIS — M810 Age-related osteoporosis without current pathological fracture: Secondary | ICD-10-CM | POA: Diagnosis not present

## 2019-07-14 DIAGNOSIS — M8580 Other specified disorders of bone density and structure, unspecified site: Secondary | ICD-10-CM

## 2019-08-03 ENCOUNTER — Other Ambulatory Visit: Payer: Self-pay

## 2019-08-03 ENCOUNTER — Ambulatory Visit
Admission: RE | Admit: 2019-08-03 | Discharge: 2019-08-03 | Disposition: A | Discharge status: Home / Self Care | Payer: Medicare Other | Source: Ambulatory Visit | Attending: Family Medicine | Admitting: Family Medicine

## 2019-08-03 DIAGNOSIS — Z9289 Personal history of other medical treatment: Secondary | ICD-10-CM

## 2019-08-03 DIAGNOSIS — Z1231 Encounter for screening mammogram for malignant neoplasm of breast: Secondary | ICD-10-CM | POA: Diagnosis not present

## 2019-09-02 DIAGNOSIS — Z23 Encounter for immunization: Secondary | ICD-10-CM | POA: Diagnosis not present

## 2019-09-05 ENCOUNTER — Other Ambulatory Visit: Payer: Self-pay

## 2019-09-05 ENCOUNTER — Ambulatory Visit
Admission: RE | Admit: 2019-09-05 | Discharge: 2019-09-05 | Disposition: A | Discharge status: Home / Self Care | Payer: Medicare Other | Source: Ambulatory Visit | Attending: Certified Nurse Midwife | Admitting: Certified Nurse Midwife

## 2019-09-05 DIAGNOSIS — Z780 Asymptomatic menopausal state: Secondary | ICD-10-CM

## 2019-09-05 DIAGNOSIS — M8589 Other specified disorders of bone density and structure, multiple sites: Secondary | ICD-10-CM | POA: Diagnosis not present

## 2019-09-05 DIAGNOSIS — M8580 Other specified disorders of bone density and structure, unspecified site: Secondary | ICD-10-CM

## 2019-09-08 ENCOUNTER — Other Ambulatory Visit: Payer: Self-pay

## 2019-09-08 ENCOUNTER — Ambulatory Visit: Payer: Medicare Other | Admitting: Podiatry

## 2019-09-08 VITALS — Temp 98.2°F

## 2019-09-08 DIAGNOSIS — M779 Enthesopathy, unspecified: Secondary | ICD-10-CM | POA: Diagnosis not present

## 2019-09-08 DIAGNOSIS — B070 Plantar wart: Secondary | ICD-10-CM | POA: Diagnosis not present

## 2019-09-30 NOTE — Progress Notes (Signed)
  Subjective:  Patient ID: Rose Garza, female    DOB: [birth year],  MRN: [id number]  Chief Complaint  Patient presents with  . Foot Pain    Pt states bilateral sub 5th lesions which are painful, duration 3-4 months, painful when walking.  . Foot Problem    Right sub 1st lesion, pt has not noticed it.    73 y.o. female presents with the above complaint.  History as above   Review of Systems: Negative except as noted in the HPI. Denies N/V/F/Ch.  Past Medical History:  Diagnosis Date  . Atrophic vaginitis   . Dyspareunia   . Fibroid   . Hematuria    negative workup  . Hypercholesteremia   . Hypertension   . Kidney stones   . Migraines   . MVP (mitral valve prolapse)   . Osteopenia of femoral neck 06/2015   left hip - 2.3.  on fosamax    Current Outpatient Medications:  .  atorvastatin (LIPITOR) 10 MG tablet, Take 10 mg by mouth daily., Disp: , Rfl:  .  Cholecalciferol (VITAMIN D PO), Take 1,000 Int'l Units by mouth daily. , Disp: , Rfl:  .  citalopram (CELEXA) 20 MG tablet, Take 20 mg by mouth daily., Disp: , Rfl:  .  nitrofurantoin (MACRODANTIN) 100 MG capsule, , Disp: , Rfl:  .  nitrofurantoin, macrocrystal-monohydrate, (MACROBID) 100 MG capsule, Take 1 capsule (100 mg total) by mouth 2 (two) times daily. Take one capsule BID x 10 days, Disp: 20 capsule, Rfl: 0 .  raloxifene (EVISTA) 60 MG tablet, , Disp: , Rfl:  .  SYNTHROID 100 MCG tablet, , Disp: , Rfl:  .  verapamil (CALAN-SR) 240 MG CR tablet, , Disp: , Rfl:   Social History   Tobacco Use  Smoking Status Never Smoker  Smokeless Tobacco Never Used    Allergies  Allergen Reactions  . Bactrim Ds [Sulfamethoxazole-Trimethoprim] Nausea Only and Swelling  . Codeine Nausea And Vomiting  . Xanax [Alprazolam]     headache   Objective:   Vitals:   09/08/19 0828  Temp: 98.2 F (36.8 C)   There is no height or weight on file to calculate BMI. Constitutional Well developed. Well nourished.  Vascular  Dorsalis pedis pulses palpable bilaterally. Posterior tibial pulses palpable bilaterally. Capillary refill normal to all digits.  No cyanosis or clubbing noted. Pedal hair growth normal.  Neurologic Normal speech. Oriented to person, place, and time. Epicritic sensation to light touch grossly present bilaterally.  Dermatologic Nails normal Skin hyperkeratotic lesions plantar sub-met 5 bilaterally, right sub-met 1  Orthopedic: Normal joint ROM without pain or crepitus bilaterally. No visible deformities. No bony tenderness.   Radiographs: None Assessment:   1. Verruca plantaris   2. Capsulitis    Plan:  Patient was evaluated and treated and all questions answered.  Capsulitis, Punctate keratoses bilat, possible verruca -Educated on possible etiologies -Lesions debrided x3. Salinocaine applied to soften and destroyed lesions  No follow-ups on file.

## 2019-10-20 ENCOUNTER — Ambulatory Visit (INDEPENDENT_AMBULATORY_CARE_PROVIDER_SITE_OTHER): Payer: Medicare Other | Admitting: Podiatry

## 2019-10-20 ENCOUNTER — Other Ambulatory Visit: Payer: Self-pay

## 2019-10-20 DIAGNOSIS — B070 Plantar wart: Secondary | ICD-10-CM

## 2019-10-20 NOTE — Progress Notes (Signed)
Subjective:  Patient ID: Rose Garza, female    DOB: [birth year],  MRN: [id number]  Chief Complaint  Patient presents with  . Plantar Warts    Pt states right sub 5th wart is still painful, left sub 5th has no pain, and right sub 1st has no pain.    73 y.o. female presents with the above complaint.  History as above states that the area are feeling much better but she still having some pain  Review of Systems: Negative except as noted in the HPI. Denies N/V/F/Ch.  Past Medical History:  Diagnosis Date  . Atrophic vaginitis   . Dyspareunia   . Fibroid   . Hematuria    negative workup  . Hypercholesteremia   . Hypertension   . Kidney stones   . Migraines   . MVP (mitral valve prolapse)   . Osteopenia of femoral neck 06/2015   left hip - 2.3.  on fosamax    Current Outpatient Medications:  .  atorvastatin (LIPITOR) 10 MG tablet, Take 10 mg by mouth daily., Disp: , Rfl:  .  Cholecalciferol (VITAMIN D PO), Take 1,000 Int'l Units by mouth daily. , Disp: , Rfl:  .  citalopram (CELEXA) 20 MG tablet, Take 20 mg by mouth daily., Disp: , Rfl:  .  nitrofurantoin (MACRODANTIN) 100 MG capsule, , Disp: , Rfl:  .  nitrofurantoin, macrocrystal-monohydrate, (MACROBID) 100 MG capsule, Take 1 capsule (100 mg total) by mouth 2 (two) times daily. Take one capsule BID x 10 days, Disp: 20 capsule, Rfl: 0 .  raloxifene (EVISTA) 60 MG tablet, , Disp: , Rfl:  .  SYNTHROID 100 MCG tablet, , Disp: , Rfl:  .  verapamil (CALAN-SR) 240 MG CR tablet, , Disp: , Rfl:   Social History   Tobacco Use  Smoking Status Never Smoker  Smokeless Tobacco Never Used    Allergies  Allergen Reactions  . Bactrim Ds [Sulfamethoxazole-Trimethoprim] Nausea Only and Swelling  . Codeine Nausea And Vomiting  . Xanax [Alprazolam]     headache   Objective:   There were no vitals filed for this visit. There is no height or weight on file to calculate BMI. Constitutional Well developed. Well nourished.   Vascular Dorsalis pedis pulses palpable bilaterally. Posterior tibial pulses palpable bilaterally. Capillary refill normal to all digits.  No cyanosis or clubbing noted. Pedal hair growth normal.  Neurologic Normal speech. Oriented to person, place, and time. Epicritic sensation to light touch grossly present bilaterally.  Dermatologic Nails normal Skin hyperkeratotic lesions plantar sub-met 5 bilaterally, right sub-met 1  Orthopedic: Normal joint ROM without pain or crepitus bilaterally. No visible deformities. No bony tenderness.   Radiographs: None Assessment:   No diagnosis found. Plan:  Patient was evaluated and treated and all questions answered.  Capsulitis, Punctate keratoses bilat, possible verruca -Again discussed that given the location of the lesions I do think that they are likely to have a osseous cause however they do appear to have some verrucous-like appearance with particularly.  The lesions were again debrided to lidocaine was applied to destroy them.  Follow-up in another month for recheck  Procedure: Destruction of Lesion Location: Bilateral fifth MPJ, right first MPJ Anesthesia: none Instrumentation: 15 blade. Technique: Debridement of lesion to petechial bleeding.  Small amount of Salinocaine applied to the base of the lesion. Dressing: Dry, sterile, compression dressing. Disposition: Patient tolerated procedure well. Advised to leave dressing on for 6-8 hours. Thereafter patient to wash the area with soap and  water and applied band-aid. Off-loading pads dispensed. Patient to return in 2 weeks for follow-up.   No follow-ups on file.

## 2019-11-20 DIAGNOSIS — F411 Generalized anxiety disorder: Secondary | ICD-10-CM | POA: Diagnosis not present

## 2019-11-20 DIAGNOSIS — Z0000 Encounter for general adult medical examination without abnormal findings: Secondary | ICD-10-CM | POA: Diagnosis not present

## 2019-11-20 DIAGNOSIS — I119 Hypertensive heart disease without heart failure: Secondary | ICD-10-CM | POA: Diagnosis not present

## 2019-11-20 DIAGNOSIS — E039 Hypothyroidism, unspecified: Secondary | ICD-10-CM | POA: Diagnosis not present

## 2019-11-23 ENCOUNTER — Ambulatory Visit: Payer: Medicare Other | Admitting: Podiatry

## 2019-11-23 ENCOUNTER — Other Ambulatory Visit: Payer: Self-pay

## 2019-11-23 DIAGNOSIS — B070 Plantar wart: Secondary | ICD-10-CM | POA: Diagnosis not present

## 2019-11-23 DIAGNOSIS — M779 Enthesopathy, unspecified: Secondary | ICD-10-CM

## 2019-12-24 NOTE — Progress Notes (Signed)
Subjective:  Patient ID: Rose Garza, female    DOB: [birth year],  MRN: [id number]  Chief Complaint  Patient presents with  . Plantar Warts    pt is here for a 1 month wart check, pt also states that the wart is looking a lot better since the last time she was here, pt also has no other comments or concerns    74 y.o. female presents with the above complaint.  History as above    Review of Systems: Negative except as noted in the HPI. Denies N/V/F/Ch.  Past Medical History:  Diagnosis Date  . Atrophic vaginitis   . Dyspareunia   . Fibroid   . Hematuria    negative workup  . Hypercholesteremia   . Hypertension   . Kidney stones   . Migraines   . MVP (mitral valve prolapse)   . Osteopenia of femoral neck 06/2015   left hip - 2.3.  on fosamax    Current Outpatient Medications:  .  atorvastatin (LIPITOR) 10 MG tablet, Take 10 mg by mouth daily., Disp: , Rfl:  .  Cholecalciferol (VITAMIN D PO), Take 1,000 Int'l Units by mouth daily. , Disp: , Rfl:  .  citalopram (CELEXA) 20 MG tablet, Take 20 mg by mouth daily., Disp: , Rfl:  .  nitrofurantoin (MACRODANTIN) 100 MG capsule, , Disp: , Rfl:  .  nitrofurantoin, macrocrystal-monohydrate, (MACROBID) 100 MG capsule, Take 1 capsule (100 mg total) by mouth 2 (two) times daily. Take one capsule BID x 10 days, Disp: 20 capsule, Rfl: 0 .  raloxifene (EVISTA) 60 MG tablet, , Disp: , Rfl:  .  SYNTHROID 100 MCG tablet, , Disp: , Rfl:  .  verapamil (CALAN-SR) 240 MG CR tablet, , Disp: , Rfl:   Social History   Tobacco Use  Smoking Status Never Smoker  Smokeless Tobacco Never Used    Allergies  Allergen Reactions  . Bactrim Ds [Sulfamethoxazole-Trimethoprim] Nausea Only and Swelling  . Codeine Nausea And Vomiting  . Xanax [Alprazolam]     headache   Objective:   There were no vitals filed for this visit. There is no height or weight on file to calculate BMI. Constitutional Well developed. Well nourished.  Vascular Dorsalis  pedis pulses palpable bilaterally. Posterior tibial pulses palpable bilaterally. Capillary refill normal to all digits.  No cyanosis or clubbing noted. Pedal hair growth normal.  Neurologic Normal speech. Oriented to person, place, and time. Epicritic sensation to light touch grossly present bilaterally.  Dermatologic Nails normal Skin hyperkeratotic lesions plantar sub-met 5 bilaterally, right sub-met 1  Orthopedic: Normal joint ROM without pain or crepitus bilaterally. No visible deformities. No bony tenderness.   Radiographs: None Assessment:   1. Verruca plantaris   2. Capsulitis    Plan:  Patient was evaluated and treated and all questions answered.  Capsulitis, Punctate keratoses bilat, possible verruca -Lesions again destroyed.  Procedure: Destruction of Lesion Location: bilateral 5th MPJ, right 1st MPJ Anesthesia: none Instrumentation: 15 blade. Technique: Debridement of lesion to petechial bleeding. Aperture pad applied around lesion. Small amount of canthrone applied to the base of the lesion. Dressing: Dry, sterile, compression dressing. Disposition: Patient tolerated procedure well. Advised to leave dressing on for 6-8 hours. Thereafter patient to wash the area with soap and water and applied band-aid. Off-loading pads dispensed.    No follow-ups on file.

## 2020-01-04 ENCOUNTER — Other Ambulatory Visit: Payer: Self-pay

## 2020-01-04 ENCOUNTER — Ambulatory Visit: Payer: Medicare Other | Admitting: Podiatry

## 2020-01-04 DIAGNOSIS — B070 Plantar wart: Secondary | ICD-10-CM | POA: Diagnosis not present

## 2020-01-04 NOTE — Progress Notes (Signed)
  Subjective:  Patient ID: Rose Garza, female    DOB: 08-27-1946,  MRN: [id number]  Chief Complaint  Patient presents with  . Callouses    pt is here for a plantar wart check, pt states that she still feels the callouses but is overall feeling better   74 y.o. female presents with the above complaint. History confirmed with patient.   Objective:  Physical Exam: warm, good capillary refill, no trophic changes or ulcerative lesions, normal DP and PT pulses and normal sensory exam. Keratoses 5th MPJ with petechiate bilat  Assessment:   1. Verruca plantaris    Plan:  Patient was evaluated and treated and all questions answered.  Verruca, porokeratoses -Debrided and destroyed as below  Procedure: Destruction of Lesion Location: bilateral 5th MPJ Anesthesia: none Instrumentation: 15 blade. Technique: Debridement of lesion to petechial bleeding. Aperture pad applied around lesion. Small amount of canthrone applied to the base of the lesion. Dressing: Dry, sterile, compression dressing. Disposition: Patient tolerated procedure well. Advised to leave dressing on for 6-8 hours. Thereafter patient to wash the area with soap and water and applied band-aid. Off-loading pads dispensed. Patient to return in 2 weeks for follow-up.  No follow-ups on file.   MDM

## 2020-01-13 ENCOUNTER — Ambulatory Visit: Payer: Medicare Other

## 2020-01-15 ENCOUNTER — Ambulatory Visit: Payer: Medicare Other | Attending: Internal Medicine

## 2020-01-15 DIAGNOSIS — Z23 Encounter for immunization: Secondary | ICD-10-CM | POA: Insufficient documentation

## 2020-01-15 NOTE — Progress Notes (Signed)
   Covid-19 Vaccination Clinic  Name:  Rose Garza    MRN: [id number] DOB: 07/24/1946  01/15/2020  [name] was observed post Covid-19 immunization for 15 minutes without incidence. She was provided with Vaccine Information Sheet and instruction to access the V-Safe system.   [name] was instructed to call 911 with any severe reactions post vaccine: Marland Kitchen Difficulty breathing  . Swelling of your face and throat  . A fast heartbeat  . A bad rash all over your body  . Dizziness and weakness    Immunizations Administered    Name Date Dose VIS Date Route   Pfizer COVID-19 Vaccine 01/15/2020 10:55 AM 0.3 mL 12/01/2019 Intramuscular   Manufacturer: [place]   Lot: [id number]   [place]: [id number]

## 2020-02-02 ENCOUNTER — Ambulatory Visit: Payer: Medicare Other | Attending: Internal Medicine

## 2020-02-02 DIAGNOSIS — Z23 Encounter for immunization: Secondary | ICD-10-CM

## 2020-02-02 NOTE — Progress Notes (Signed)
   Covid-19 Vaccination Clinic  Name:  Rose Garza    MRN: [id number] DOB: 1946/01/19  02/02/2020  [name] was observed post Covid-19 immunization for 15 minutes without incidence. She was provided with Vaccine Information Sheet and instruction to access the V-Safe system.   [name] was instructed to call 911 with any severe reactions post vaccine: Marland Kitchen Difficulty breathing  . Swelling of your face and throat  . A fast heartbeat  . A bad rash all over your body  . Dizziness and weakness    Immunizations Administered    Name Date Dose VIS Date Route   Pfizer COVID-19 Vaccine 02/02/2020 11:59 AM 0.3 mL 12/01/2019 Intramuscular   Manufacturer: [place]   Lot: [id number]   [place]: [id number]

## 2020-02-07 DIAGNOSIS — K8020 Calculus of gallbladder without cholecystitis without obstruction: Secondary | ICD-10-CM | POA: Diagnosis not present

## 2020-03-12 ENCOUNTER — Encounter: Payer: Self-pay | Admitting: Certified Nurse Midwife

## 2020-05-10 DIAGNOSIS — D2361 Other benign neoplasm of skin of right upper limb, including shoulder: Secondary | ICD-10-CM | POA: Diagnosis not present

## 2020-05-10 DIAGNOSIS — L821 Other seborrheic keratosis: Secondary | ICD-10-CM | POA: Diagnosis not present

## 2020-05-10 DIAGNOSIS — D1801 Hemangioma of skin and subcutaneous tissue: Secondary | ICD-10-CM | POA: Diagnosis not present

## 2020-05-10 DIAGNOSIS — Z85828 Personal history of other malignant neoplasm of skin: Secondary | ICD-10-CM | POA: Diagnosis not present

## 2020-05-17 DIAGNOSIS — E782 Mixed hyperlipidemia: Secondary | ICD-10-CM | POA: Diagnosis not present

## 2020-05-17 DIAGNOSIS — R490 Dysphonia: Secondary | ICD-10-CM | POA: Diagnosis not present

## 2020-05-17 DIAGNOSIS — E039 Hypothyroidism, unspecified: Secondary | ICD-10-CM | POA: Diagnosis not present

## 2020-05-17 DIAGNOSIS — I119 Hypertensive heart disease without heart failure: Secondary | ICD-10-CM | POA: Diagnosis not present

## 2020-07-02 DIAGNOSIS — H6983 Other specified disorders of Eustachian tube, bilateral: Secondary | ICD-10-CM | POA: Diagnosis not present

## 2020-07-02 DIAGNOSIS — J310 Chronic rhinitis: Secondary | ICD-10-CM | POA: Insufficient documentation

## 2020-07-02 DIAGNOSIS — H6993 Unspecified Eustachian tube disorder, bilateral: Secondary | ICD-10-CM | POA: Insufficient documentation

## 2020-07-02 DIAGNOSIS — R0989 Other specified symptoms and signs involving the circulatory and respiratory systems: Secondary | ICD-10-CM | POA: Diagnosis not present

## 2020-07-02 DIAGNOSIS — H9193 Unspecified hearing loss, bilateral: Secondary | ICD-10-CM | POA: Insufficient documentation

## 2020-07-02 DIAGNOSIS — R09A2 Foreign body sensation, throat: Secondary | ICD-10-CM | POA: Insufficient documentation

## 2020-07-17 ENCOUNTER — Ambulatory Visit: Payer: Medicare Other | Admitting: Certified Nurse Midwife

## 2020-07-29 DIAGNOSIS — H17821 Peripheral opacity of cornea, right eye: Secondary | ICD-10-CM | POA: Diagnosis not present

## 2020-07-29 DIAGNOSIS — Z961 Presence of intraocular lens: Secondary | ICD-10-CM | POA: Diagnosis not present

## 2020-07-29 DIAGNOSIS — H04123 Dry eye syndrome of bilateral lacrimal glands: Secondary | ICD-10-CM | POA: Diagnosis not present

## 2020-07-29 DIAGNOSIS — H26491 Other secondary cataract, right eye: Secondary | ICD-10-CM | POA: Diagnosis not present

## 2020-08-07 ENCOUNTER — Other Ambulatory Visit: Payer: Self-pay | Admitting: Family Medicine

## 2020-08-07 DIAGNOSIS — Z1231 Encounter for screening mammogram for malignant neoplasm of breast: Secondary | ICD-10-CM

## 2020-08-12 DIAGNOSIS — H26491 Other secondary cataract, right eye: Secondary | ICD-10-CM | POA: Diagnosis not present

## 2020-08-21 ENCOUNTER — Ambulatory Visit
Admission: RE | Admit: 2020-08-21 | Discharge: 2020-08-21 | Disposition: A | Discharge status: Home / Self Care | Payer: Medicare Other | Source: Ambulatory Visit | Attending: Family Medicine | Admitting: Family Medicine

## 2020-08-21 ENCOUNTER — Other Ambulatory Visit: Payer: Self-pay

## 2020-08-21 DIAGNOSIS — Z1231 Encounter for screening mammogram for malignant neoplasm of breast: Secondary | ICD-10-CM | POA: Diagnosis not present

## 2020-09-02 DIAGNOSIS — J310 Chronic rhinitis: Secondary | ICD-10-CM | POA: Diagnosis not present

## 2020-09-02 DIAGNOSIS — R0989 Other specified symptoms and signs involving the circulatory and respiratory systems: Secondary | ICD-10-CM | POA: Diagnosis not present

## 2020-09-02 DIAGNOSIS — H9193 Unspecified hearing loss, bilateral: Secondary | ICD-10-CM | POA: Diagnosis not present

## 2020-09-02 DIAGNOSIS — H903 Sensorineural hearing loss, bilateral: Secondary | ICD-10-CM | POA: Diagnosis not present

## 2020-09-14 DIAGNOSIS — Z23 Encounter for immunization: Secondary | ICD-10-CM | POA: Diagnosis not present

## 2020-10-22 ENCOUNTER — Other Ambulatory Visit (HOSPITAL_COMMUNITY): Payer: Self-pay | Admitting: *Deleted

## 2020-10-23 ENCOUNTER — Other Ambulatory Visit: Payer: Self-pay

## 2020-10-23 ENCOUNTER — Ambulatory Visit (HOSPITAL_COMMUNITY)
Admission: RE | Admit: 2020-10-23 | Discharge: 2020-10-23 | Disposition: A | Discharge status: Home / Self Care | Payer: Medicare Other | Source: Ambulatory Visit | Attending: Internal Medicine | Admitting: Internal Medicine

## 2020-10-23 LAB — POCT HEMOGLOBIN-HEMACUE: Hemoglobin: 14.4 g/dL (ref 12.0–15.0)

## 2020-10-23 MED ORDER — LIDOCAINE HCL 2 % IJ SOLN: 0.1000 mL | Freq: Once | INTRAMUSCULAR | Status: DC

## 2020-10-23 MED ORDER — LIDOCAINE HCL (PF) 1 % IJ SOLN
INTRAMUSCULAR | Status: AC
  Administered 2020-10-23: 2 mL via INTRADERMAL
  Filled 2020-10-23: qty 2

## 2020-11-25 DIAGNOSIS — H6123 Impacted cerumen, bilateral: Secondary | ICD-10-CM | POA: Diagnosis not present

## 2020-11-25 DIAGNOSIS — H6121 Impacted cerumen, right ear: Secondary | ICD-10-CM | POA: Insufficient documentation

## 2020-12-24 DIAGNOSIS — F419 Anxiety disorder, unspecified: Secondary | ICD-10-CM | POA: Diagnosis not present

## 2020-12-24 DIAGNOSIS — I10 Essential (primary) hypertension: Secondary | ICD-10-CM | POA: Diagnosis not present

## 2020-12-24 DIAGNOSIS — E039 Hypothyroidism, unspecified: Secondary | ICD-10-CM | POA: Diagnosis not present

## 2020-12-24 DIAGNOSIS — Z130 Encounter for screening for diseases of the blood and blood-forming organs and certain disorders involving the immune mechanism: Secondary | ICD-10-CM | POA: Diagnosis not present

## 2020-12-24 DIAGNOSIS — Z1211 Encounter for screening for malignant neoplasm of colon: Secondary | ICD-10-CM | POA: Diagnosis not present

## 2020-12-24 DIAGNOSIS — E782 Mixed hyperlipidemia: Secondary | ICD-10-CM | POA: Diagnosis not present

## 2021-01-21 DIAGNOSIS — R7989 Other specified abnormal findings of blood chemistry: Secondary | ICD-10-CM | POA: Diagnosis not present

## 2021-01-31 DIAGNOSIS — I10 Essential (primary) hypertension: Secondary | ICD-10-CM | POA: Diagnosis not present

## 2021-01-31 DIAGNOSIS — I351 Nonrheumatic aortic (valve) insufficiency: Secondary | ICD-10-CM | POA: Diagnosis not present

## 2021-01-31 DIAGNOSIS — I341 Nonrheumatic mitral (valve) prolapse: Secondary | ICD-10-CM | POA: Diagnosis not present

## 2021-02-11 DIAGNOSIS — I341 Nonrheumatic mitral (valve) prolapse: Secondary | ICD-10-CM | POA: Diagnosis not present

## 2021-02-11 DIAGNOSIS — I351 Nonrheumatic aortic (valve) insufficiency: Secondary | ICD-10-CM | POA: Diagnosis not present

## 2021-02-11 DIAGNOSIS — I10 Essential (primary) hypertension: Secondary | ICD-10-CM | POA: Diagnosis not present

## 2021-03-27 DIAGNOSIS — E039 Hypothyroidism, unspecified: Secondary | ICD-10-CM | POA: Diagnosis not present

## 2021-04-16 DIAGNOSIS — Z860101 Personal history of adenomatous and serrated colon polyps: Secondary | ICD-10-CM | POA: Insufficient documentation

## 2021-04-28 ENCOUNTER — Other Ambulatory Visit (HOSPITAL_COMMUNITY): Payer: Self-pay

## 2021-04-29 ENCOUNTER — Other Ambulatory Visit: Payer: Self-pay

## 2021-04-29 ENCOUNTER — Ambulatory Visit (HOSPITAL_COMMUNITY)
Admission: RE | Admit: 2021-04-29 | Discharge: 2021-04-29 | Disposition: A | Discharge status: Home / Self Care | Payer: Medicare Other | Source: Ambulatory Visit | Attending: Internal Medicine | Admitting: Internal Medicine

## 2021-04-29 MED ORDER — LIDOCAINE HCL (PF) 1 % IJ SOLN
2.0000 mL | Freq: Once | INTRAMUSCULAR | Status: AC
  Administered 2021-04-29: 2 mL via INTRADERMAL

## 2021-04-29 MED ORDER — LIDOCAINE HCL (PF) 1 % IJ SOLN
INTRAMUSCULAR | Status: AC
  Filled 2021-04-29: qty 2

## 2021-04-29 NOTE — Progress Notes (Signed)
Pt here for therapeutic phlebotomy. Hemocue 14.7. 500 ml (per MD order) blood removed from left antecubital area after placing intradermal lidocaine. Pt reported feeling light headed upon completion. Placed head back, feet elevated. Within minutes patient's color improved and reported feeling better. VSS. Drink given. Pt DC home after waiting for 15 minutes. Ambulated out with no further reports of being light headed or dizzy.

## 2021-05-07 LAB — POCT HEMOGLOBIN-HEMACUE: Hemoglobin: 14.7 g/dL (ref 12.0–15.0)

## 2021-05-28 DIAGNOSIS — D2261 Melanocytic nevi of right upper limb, including shoulder: Secondary | ICD-10-CM | POA: Diagnosis not present

## 2021-05-28 DIAGNOSIS — Z85828 Personal history of other malignant neoplasm of skin: Secondary | ICD-10-CM | POA: Diagnosis not present

## 2021-05-28 DIAGNOSIS — D2239 Melanocytic nevi of other parts of face: Secondary | ICD-10-CM | POA: Diagnosis not present

## 2021-05-28 DIAGNOSIS — D2361 Other benign neoplasm of skin of right upper limb, including shoulder: Secondary | ICD-10-CM | POA: Diagnosis not present

## 2021-06-13 DIAGNOSIS — R21 Rash and other nonspecific skin eruption: Secondary | ICD-10-CM | POA: Diagnosis not present

## 2021-06-13 DIAGNOSIS — I10 Essential (primary) hypertension: Secondary | ICD-10-CM | POA: Diagnosis not present

## 2021-06-24 DIAGNOSIS — E782 Mixed hyperlipidemia: Secondary | ICD-10-CM | POA: Diagnosis not present

## 2021-06-24 DIAGNOSIS — E039 Hypothyroidism, unspecified: Secondary | ICD-10-CM | POA: Diagnosis not present

## 2021-06-24 DIAGNOSIS — F419 Anxiety disorder, unspecified: Secondary | ICD-10-CM | POA: Diagnosis not present

## 2021-06-24 DIAGNOSIS — Z0000 Encounter for general adult medical examination without abnormal findings: Secondary | ICD-10-CM | POA: Diagnosis not present

## 2021-06-24 DIAGNOSIS — I10 Essential (primary) hypertension: Secondary | ICD-10-CM | POA: Diagnosis not present

## 2021-06-30 DIAGNOSIS — H6123 Impacted cerumen, bilateral: Secondary | ICD-10-CM | POA: Diagnosis not present

## 2021-09-05 DIAGNOSIS — R55 Syncope and collapse: Secondary | ICD-10-CM | POA: Diagnosis not present

## 2021-09-05 DIAGNOSIS — I351 Nonrheumatic aortic (valve) insufficiency: Secondary | ICD-10-CM | POA: Diagnosis not present

## 2021-09-05 DIAGNOSIS — I10 Essential (primary) hypertension: Secondary | ICD-10-CM | POA: Diagnosis not present

## 2021-09-12 DIAGNOSIS — Z1231 Encounter for screening mammogram for malignant neoplasm of breast: Secondary | ICD-10-CM | POA: Diagnosis not present

## 2021-10-07 DIAGNOSIS — K5730 Diverticulosis of large intestine without perforation or abscess without bleeding: Secondary | ICD-10-CM | POA: Diagnosis not present

## 2021-10-07 DIAGNOSIS — Z1211 Encounter for screening for malignant neoplasm of colon: Secondary | ICD-10-CM | POA: Diagnosis not present

## 2021-10-07 DIAGNOSIS — Z86010 Personal history of colonic polyps: Secondary | ICD-10-CM | POA: Diagnosis not present

## 2021-12-23 DIAGNOSIS — M810 Age-related osteoporosis without current pathological fracture: Secondary | ICD-10-CM | POA: Diagnosis not present

## 2021-12-23 DIAGNOSIS — E782 Mixed hyperlipidemia: Secondary | ICD-10-CM | POA: Diagnosis not present

## 2021-12-23 DIAGNOSIS — I10 Essential (primary) hypertension: Secondary | ICD-10-CM | POA: Diagnosis not present

## 2021-12-23 DIAGNOSIS — E039 Hypothyroidism, unspecified: Secondary | ICD-10-CM | POA: Diagnosis not present

## 2022-01-14 ENCOUNTER — Other Ambulatory Visit (HOSPITAL_COMMUNITY)
Admission: RE | Admit: 2022-01-14 | Discharge: 2022-01-14 | Disposition: A | Discharge status: Home / Self Care | Payer: Medicare Other | Source: Ambulatory Visit | Attending: Gastroenterology | Admitting: Gastroenterology

## 2022-01-14 ENCOUNTER — Other Ambulatory Visit: Payer: Self-pay

## 2022-01-14 ENCOUNTER — Encounter (HOSPITAL_COMMUNITY)
Admission: RE | Admit: 2022-01-14 | Discharge: 2022-01-14 | Disposition: A | Discharge status: Home / Self Care | Payer: Medicare Other | Source: Ambulatory Visit | Attending: Internal Medicine | Admitting: Internal Medicine

## 2022-01-14 MED ORDER — LIDOCAINE HCL (PF) 1 % IJ SOLN
INTRAMUSCULAR | Status: AC
  Administered 2022-01-14: 15:00:00 2 mL via INTRADERMAL
  Filled 2022-01-14: qty 2

## 2022-01-14 MED ORDER — LIDOCAINE HCL 1 % IJ SOLN
5.0000 mL | Freq: Once | INTRAMUSCULAR | Status: AC
  Filled 2022-01-14: qty 5

## 2022-01-20 ENCOUNTER — Ambulatory Visit (HOSPITAL_COMMUNITY): Payer: Medicare Other

## 2022-02-03 DIAGNOSIS — I10 Essential (primary) hypertension: Secondary | ICD-10-CM | POA: Diagnosis not present

## 2022-02-03 DIAGNOSIS — F419 Anxiety disorder, unspecified: Secondary | ICD-10-CM | POA: Diagnosis not present

## 2022-02-03 DIAGNOSIS — E782 Mixed hyperlipidemia: Secondary | ICD-10-CM | POA: Diagnosis not present

## 2022-02-03 DIAGNOSIS — E039 Hypothyroidism, unspecified: Secondary | ICD-10-CM | POA: Diagnosis not present

## 2022-02-17 DIAGNOSIS — M6281 Muscle weakness (generalized): Secondary | ICD-10-CM | POA: Diagnosis not present

## 2022-02-17 DIAGNOSIS — R2689 Other abnormalities of gait and mobility: Secondary | ICD-10-CM | POA: Diagnosis not present

## 2022-02-17 DIAGNOSIS — R2681 Unsteadiness on feet: Secondary | ICD-10-CM | POA: Diagnosis not present

## 2022-02-17 DIAGNOSIS — Z9181 History of falling: Secondary | ICD-10-CM | POA: Diagnosis not present

## 2022-02-19 DIAGNOSIS — D751 Secondary polycythemia: Secondary | ICD-10-CM | POA: Diagnosis not present

## 2022-02-19 DIAGNOSIS — D7589 Other specified diseases of blood and blood-forming organs: Secondary | ICD-10-CM | POA: Diagnosis not present

## 2022-02-19 DIAGNOSIS — E039 Hypothyroidism, unspecified: Secondary | ICD-10-CM | POA: Diagnosis not present

## 2022-02-19 DIAGNOSIS — D72821 Monocytosis (symptomatic): Secondary | ICD-10-CM | POA: Diagnosis not present

## 2022-02-19 DIAGNOSIS — I10 Essential (primary) hypertension: Secondary | ICD-10-CM | POA: Diagnosis not present

## 2022-02-19 DIAGNOSIS — M810 Age-related osteoporosis without current pathological fracture: Secondary | ICD-10-CM | POA: Diagnosis not present

## 2022-03-03 DIAGNOSIS — R2681 Unsteadiness on feet: Secondary | ICD-10-CM | POA: Diagnosis not present

## 2022-03-03 DIAGNOSIS — Z9181 History of falling: Secondary | ICD-10-CM | POA: Diagnosis not present

## 2022-03-04 DIAGNOSIS — K760 Fatty (change of) liver, not elsewhere classified: Secondary | ICD-10-CM | POA: Diagnosis not present

## 2022-03-04 DIAGNOSIS — K8020 Calculus of gallbladder without cholecystitis without obstruction: Secondary | ICD-10-CM | POA: Diagnosis not present

## 2022-03-17 DIAGNOSIS — Z9181 History of falling: Secondary | ICD-10-CM | POA: Diagnosis not present

## 2022-03-31 DIAGNOSIS — R2689 Other abnormalities of gait and mobility: Secondary | ICD-10-CM | POA: Diagnosis not present

## 2022-03-31 DIAGNOSIS — Z9181 History of falling: Secondary | ICD-10-CM | POA: Diagnosis not present

## 2022-05-11 DIAGNOSIS — E039 Hypothyroidism, unspecified: Secondary | ICD-10-CM | POA: Diagnosis not present

## 2022-05-11 DIAGNOSIS — E782 Mixed hyperlipidemia: Secondary | ICD-10-CM | POA: Diagnosis not present

## 2022-05-11 DIAGNOSIS — K8020 Calculus of gallbladder without cholecystitis without obstruction: Secondary | ICD-10-CM | POA: Diagnosis not present

## 2022-05-11 DIAGNOSIS — I10 Essential (primary) hypertension: Secondary | ICD-10-CM | POA: Diagnosis not present

## 2022-05-12 DIAGNOSIS — I351 Nonrheumatic aortic (valve) insufficiency: Secondary | ICD-10-CM | POA: Diagnosis not present

## 2022-05-12 DIAGNOSIS — R55 Syncope and collapse: Secondary | ICD-10-CM | POA: Diagnosis not present

## 2022-05-12 DIAGNOSIS — I341 Nonrheumatic mitral (valve) prolapse: Secondary | ICD-10-CM | POA: Diagnosis not present

## 2022-05-12 DIAGNOSIS — I10 Essential (primary) hypertension: Secondary | ICD-10-CM | POA: Diagnosis not present

## 2022-05-29 DIAGNOSIS — D1801 Hemangioma of skin and subcutaneous tissue: Secondary | ICD-10-CM | POA: Diagnosis not present

## 2022-05-29 DIAGNOSIS — L821 Other seborrheic keratosis: Secondary | ICD-10-CM | POA: Diagnosis not present

## 2022-05-29 DIAGNOSIS — L720 Epidermal cyst: Secondary | ICD-10-CM | POA: Diagnosis not present

## 2022-05-29 DIAGNOSIS — Z85828 Personal history of other malignant neoplasm of skin: Secondary | ICD-10-CM | POA: Diagnosis not present

## 2022-06-25 DIAGNOSIS — H10413 Chronic giant papillary conjunctivitis, bilateral: Secondary | ICD-10-CM | POA: Diagnosis not present

## 2022-06-25 DIAGNOSIS — H17821 Peripheral opacity of cornea, right eye: Secondary | ICD-10-CM | POA: Diagnosis not present

## 2022-06-25 DIAGNOSIS — H02824 Cysts of left upper eyelid: Secondary | ICD-10-CM | POA: Diagnosis not present

## 2022-06-25 DIAGNOSIS — H04123 Dry eye syndrome of bilateral lacrimal glands: Secondary | ICD-10-CM | POA: Diagnosis not present

## 2022-07-07 DIAGNOSIS — Z1321 Encounter for screening for nutritional disorder: Secondary | ICD-10-CM | POA: Diagnosis not present

## 2022-07-07 DIAGNOSIS — E782 Mixed hyperlipidemia: Secondary | ICD-10-CM | POA: Diagnosis not present

## 2022-07-07 DIAGNOSIS — Z0000 Encounter for general adult medical examination without abnormal findings: Secondary | ICD-10-CM | POA: Diagnosis not present

## 2022-07-07 DIAGNOSIS — I10 Essential (primary) hypertension: Secondary | ICD-10-CM | POA: Diagnosis not present

## 2022-07-07 DIAGNOSIS — E039 Hypothyroidism, unspecified: Secondary | ICD-10-CM | POA: Diagnosis not present

## 2022-07-27 DIAGNOSIS — I10 Essential (primary) hypertension: Secondary | ICD-10-CM | POA: Diagnosis not present

## 2022-08-21 DIAGNOSIS — I10 Essential (primary) hypertension: Secondary | ICD-10-CM | POA: Diagnosis not present

## 2022-08-21 DIAGNOSIS — Z130 Encounter for screening for diseases of the blood and blood-forming organs and certain disorders involving the immune mechanism: Secondary | ICD-10-CM | POA: Diagnosis not present

## 2022-09-15 DIAGNOSIS — M8589 Other specified disorders of bone density and structure, multiple sites: Secondary | ICD-10-CM | POA: Diagnosis not present

## 2022-09-15 DIAGNOSIS — M85852 Other specified disorders of bone density and structure, left thigh: Secondary | ICD-10-CM | POA: Diagnosis not present

## 2022-09-15 DIAGNOSIS — Z1231 Encounter for screening mammogram for malignant neoplasm of breast: Secondary | ICD-10-CM | POA: Diagnosis not present

## 2022-09-15 DIAGNOSIS — M8588 Other specified disorders of bone density and structure, other site: Secondary | ICD-10-CM | POA: Diagnosis not present

## 2022-10-26 DIAGNOSIS — R2689 Other abnormalities of gait and mobility: Secondary | ICD-10-CM | POA: Diagnosis not present

## 2022-10-26 DIAGNOSIS — I10 Essential (primary) hypertension: Secondary | ICD-10-CM | POA: Diagnosis not present

## 2022-10-26 DIAGNOSIS — Z9181 History of falling: Secondary | ICD-10-CM | POA: Diagnosis not present

## 2022-12-23 DIAGNOSIS — Z9181 History of falling: Secondary | ICD-10-CM | POA: Diagnosis not present

## 2022-12-23 DIAGNOSIS — R2689 Other abnormalities of gait and mobility: Secondary | ICD-10-CM | POA: Diagnosis not present

## 2022-12-28 DIAGNOSIS — Z9181 History of falling: Secondary | ICD-10-CM | POA: Diagnosis not present

## 2022-12-28 DIAGNOSIS — R2689 Other abnormalities of gait and mobility: Secondary | ICD-10-CM | POA: Diagnosis not present

## 2022-12-31 DIAGNOSIS — R2689 Other abnormalities of gait and mobility: Secondary | ICD-10-CM | POA: Diagnosis not present

## 2022-12-31 DIAGNOSIS — Z9181 History of falling: Secondary | ICD-10-CM | POA: Diagnosis not present

## 2023-01-07 DIAGNOSIS — I10 Essential (primary) hypertension: Secondary | ICD-10-CM | POA: Diagnosis not present

## 2023-01-07 DIAGNOSIS — E039 Hypothyroidism, unspecified: Secondary | ICD-10-CM | POA: Diagnosis not present

## 2023-01-07 DIAGNOSIS — E782 Mixed hyperlipidemia: Secondary | ICD-10-CM | POA: Diagnosis not present

## 2023-01-13 DIAGNOSIS — Z9181 History of falling: Secondary | ICD-10-CM | POA: Diagnosis not present

## 2023-01-13 DIAGNOSIS — R2689 Other abnormalities of gait and mobility: Secondary | ICD-10-CM | POA: Diagnosis not present

## 2023-01-18 DIAGNOSIS — R2689 Other abnormalities of gait and mobility: Secondary | ICD-10-CM | POA: Diagnosis not present

## 2023-01-18 DIAGNOSIS — Z9181 History of falling: Secondary | ICD-10-CM | POA: Diagnosis not present

## 2023-01-20 DIAGNOSIS — Z9181 History of falling: Secondary | ICD-10-CM | POA: Diagnosis not present

## 2023-01-20 DIAGNOSIS — R2689 Other abnormalities of gait and mobility: Secondary | ICD-10-CM | POA: Diagnosis not present

## 2023-01-21 DIAGNOSIS — I351 Nonrheumatic aortic (valve) insufficiency: Secondary | ICD-10-CM | POA: Diagnosis not present

## 2023-01-21 DIAGNOSIS — I10 Essential (primary) hypertension: Secondary | ICD-10-CM | POA: Diagnosis not present

## 2023-02-03 DIAGNOSIS — Z9181 History of falling: Secondary | ICD-10-CM | POA: Diagnosis not present

## 2023-02-03 DIAGNOSIS — R2689 Other abnormalities of gait and mobility: Secondary | ICD-10-CM | POA: Diagnosis not present

## 2023-02-09 DIAGNOSIS — R2689 Other abnormalities of gait and mobility: Secondary | ICD-10-CM | POA: Diagnosis not present

## 2023-02-09 DIAGNOSIS — Z9181 History of falling: Secondary | ICD-10-CM | POA: Diagnosis not present

## 2023-02-18 DIAGNOSIS — D72821 Monocytosis (symptomatic): Secondary | ICD-10-CM | POA: Diagnosis not present

## 2023-02-18 DIAGNOSIS — E039 Hypothyroidism, unspecified: Secondary | ICD-10-CM | POA: Diagnosis not present

## 2023-02-18 DIAGNOSIS — K760 Fatty (change of) liver, not elsewhere classified: Secondary | ICD-10-CM | POA: Diagnosis not present

## 2023-02-18 DIAGNOSIS — D7589 Other specified diseases of blood and blood-forming organs: Secondary | ICD-10-CM | POA: Diagnosis not present

## 2023-02-18 DIAGNOSIS — I119 Hypertensive heart disease without heart failure: Secondary | ICD-10-CM | POA: Diagnosis not present

## 2023-02-18 DIAGNOSIS — D751 Secondary polycythemia: Secondary | ICD-10-CM | POA: Diagnosis not present

## 2023-02-18 DIAGNOSIS — M8589 Other specified disorders of bone density and structure, multiple sites: Secondary | ICD-10-CM | POA: Diagnosis not present

## 2023-03-10 DIAGNOSIS — H903 Sensorineural hearing loss, bilateral: Secondary | ICD-10-CM | POA: Diagnosis not present

## 2023-04-08 DIAGNOSIS — R7989 Other specified abnormal findings of blood chemistry: Secondary | ICD-10-CM | POA: Diagnosis not present

## 2023-04-08 DIAGNOSIS — I10 Essential (primary) hypertension: Secondary | ICD-10-CM | POA: Diagnosis not present

## 2023-04-08 DIAGNOSIS — E782 Mixed hyperlipidemia: Secondary | ICD-10-CM | POA: Diagnosis not present

## 2023-04-08 DIAGNOSIS — E039 Hypothyroidism, unspecified: Secondary | ICD-10-CM | POA: Diagnosis not present

## 2023-05-21 DIAGNOSIS — Z86010 Personal history of colonic polyps: Secondary | ICD-10-CM | POA: Diagnosis not present

## 2023-06-03 DIAGNOSIS — Z85828 Personal history of other malignant neoplasm of skin: Secondary | ICD-10-CM | POA: Diagnosis not present

## 2023-06-03 DIAGNOSIS — L821 Other seborrheic keratosis: Secondary | ICD-10-CM | POA: Diagnosis not present

## 2023-06-03 DIAGNOSIS — D225 Melanocytic nevi of trunk: Secondary | ICD-10-CM | POA: Diagnosis not present

## 2023-06-03 DIAGNOSIS — L814 Other melanin hyperpigmentation: Secondary | ICD-10-CM | POA: Diagnosis not present

## 2023-07-12 DIAGNOSIS — L659 Nonscarring hair loss, unspecified: Secondary | ICD-10-CM | POA: Diagnosis not present

## 2023-07-12 DIAGNOSIS — Z0000 Encounter for general adult medical examination without abnormal findings: Secondary | ICD-10-CM | POA: Diagnosis not present

## 2023-07-12 DIAGNOSIS — I10 Essential (primary) hypertension: Secondary | ICD-10-CM | POA: Diagnosis not present

## 2023-07-12 DIAGNOSIS — E782 Mixed hyperlipidemia: Secondary | ICD-10-CM | POA: Diagnosis not present

## 2023-07-12 DIAGNOSIS — E039 Hypothyroidism, unspecified: Secondary | ICD-10-CM | POA: Diagnosis not present

## 2023-09-07 DIAGNOSIS — Z23 Encounter for immunization: Secondary | ICD-10-CM | POA: Diagnosis not present

## 2023-09-07 DIAGNOSIS — H8110 Benign paroxysmal vertigo, unspecified ear: Secondary | ICD-10-CM | POA: Diagnosis not present

## 2023-09-07 DIAGNOSIS — H6122 Impacted cerumen, left ear: Secondary | ICD-10-CM | POA: Diagnosis not present

## 2023-09-07 DIAGNOSIS — I10 Essential (primary) hypertension: Secondary | ICD-10-CM | POA: Diagnosis not present

## 2023-09-07 DIAGNOSIS — E039 Hypothyroidism, unspecified: Secondary | ICD-10-CM | POA: Diagnosis not present

## 2023-09-09 DIAGNOSIS — R42 Dizziness and giddiness: Secondary | ICD-10-CM | POA: Diagnosis not present

## 2023-09-09 DIAGNOSIS — H8190 Unspecified disorder of vestibular function, unspecified ear: Secondary | ICD-10-CM | POA: Diagnosis not present

## 2023-09-14 DIAGNOSIS — R42 Dizziness and giddiness: Secondary | ICD-10-CM | POA: Diagnosis not present

## 2023-09-14 DIAGNOSIS — H8190 Unspecified disorder of vestibular function, unspecified ear: Secondary | ICD-10-CM | POA: Diagnosis not present

## 2023-09-21 DIAGNOSIS — I10 Essential (primary) hypertension: Secondary | ICD-10-CM | POA: Diagnosis not present

## 2023-09-21 DIAGNOSIS — H8110 Benign paroxysmal vertigo, unspecified ear: Secondary | ICD-10-CM | POA: Diagnosis not present

## 2023-09-21 DIAGNOSIS — E039 Hypothyroidism, unspecified: Secondary | ICD-10-CM | POA: Diagnosis not present

## 2023-09-22 DIAGNOSIS — R42 Dizziness and giddiness: Secondary | ICD-10-CM | POA: Diagnosis not present

## 2023-09-22 DIAGNOSIS — H8190 Unspecified disorder of vestibular function, unspecified ear: Secondary | ICD-10-CM | POA: Diagnosis not present

## 2023-09-29 DIAGNOSIS — H8190 Unspecified disorder of vestibular function, unspecified ear: Secondary | ICD-10-CM | POA: Diagnosis not present

## 2023-09-29 DIAGNOSIS — R42 Dizziness and giddiness: Secondary | ICD-10-CM | POA: Diagnosis not present

## 2023-10-06 DIAGNOSIS — R42 Dizziness and giddiness: Secondary | ICD-10-CM | POA: Diagnosis not present

## 2023-10-06 DIAGNOSIS — H8190 Unspecified disorder of vestibular function, unspecified ear: Secondary | ICD-10-CM | POA: Diagnosis not present

## 2023-10-13 DIAGNOSIS — R42 Dizziness and giddiness: Secondary | ICD-10-CM | POA: Diagnosis not present

## 2023-10-13 DIAGNOSIS — H903 Sensorineural hearing loss, bilateral: Secondary | ICD-10-CM | POA: Insufficient documentation

## 2023-10-13 DIAGNOSIS — H8190 Unspecified disorder of vestibular function, unspecified ear: Secondary | ICD-10-CM | POA: Diagnosis not present

## 2023-10-18 DIAGNOSIS — R42 Dizziness and giddiness: Secondary | ICD-10-CM | POA: Diagnosis not present

## 2023-10-18 DIAGNOSIS — H8190 Unspecified disorder of vestibular function, unspecified ear: Secondary | ICD-10-CM | POA: Diagnosis not present

## 2023-10-26 DIAGNOSIS — H8190 Unspecified disorder of vestibular function, unspecified ear: Secondary | ICD-10-CM | POA: Diagnosis not present

## 2023-10-26 DIAGNOSIS — R42 Dizziness and giddiness: Secondary | ICD-10-CM | POA: Diagnosis not present

## 2023-11-02 DIAGNOSIS — R42 Dizziness and giddiness: Secondary | ICD-10-CM | POA: Diagnosis not present

## 2023-11-02 DIAGNOSIS — H8190 Unspecified disorder of vestibular function, unspecified ear: Secondary | ICD-10-CM | POA: Diagnosis not present

## 2024-01-06 DIAGNOSIS — I340 Nonrheumatic mitral (valve) insufficiency: Secondary | ICD-10-CM | POA: Insufficient documentation

## 2024-10-18 DIAGNOSIS — N1831 Chronic kidney disease, stage 3a: Secondary | ICD-10-CM | POA: Insufficient documentation

## 2024-11-12 ENCOUNTER — Inpatient Hospital Stay (HOSPITAL_COMMUNITY)
Admission: EM | Admit: 2024-11-12 | Discharge: 2024-11-15 | DRG: 041 | Disposition: A | Discharge status: Home / Self Care | Payer: Self-pay | Attending: Student in an Organized Health Care Education/Training Program | Admitting: Student in an Organized Health Care Education/Training Program

## 2024-11-12 ENCOUNTER — Inpatient Hospital Stay (HOSPITAL_COMMUNITY): Payer: Self-pay

## 2024-11-12 ENCOUNTER — Emergency Department (HOSPITAL_COMMUNITY): Payer: Self-pay

## 2024-11-12 ENCOUNTER — Encounter (HOSPITAL_COMMUNITY): Payer: Self-pay | Admitting: Emergency Medicine

## 2024-11-12 DIAGNOSIS — Z87442 Personal history of urinary calculi: Secondary | ICD-10-CM

## 2024-11-12 DIAGNOSIS — I447 Left bundle-branch block, unspecified: Secondary | ICD-10-CM | POA: Diagnosis present

## 2024-11-12 DIAGNOSIS — Z882 Allergy status to sulfonamides status: Secondary | ICD-10-CM

## 2024-11-12 DIAGNOSIS — Z90710 Acquired absence of both cervix and uterus: Secondary | ICD-10-CM

## 2024-11-12 DIAGNOSIS — I69398 Other sequelae of cerebral infarction: Principal | ICD-10-CM

## 2024-11-12 DIAGNOSIS — M85852 Other specified disorders of bone density and structure, left thigh: Secondary | ICD-10-CM | POA: Diagnosis present

## 2024-11-12 DIAGNOSIS — R5381 Other malaise: Secondary | ICD-10-CM | POA: Diagnosis present

## 2024-11-12 DIAGNOSIS — I341 Nonrheumatic mitral (valve) prolapse: Secondary | ICD-10-CM | POA: Diagnosis present

## 2024-11-12 DIAGNOSIS — I442 Atrioventricular block, complete: Principal | ICD-10-CM | POA: Diagnosis present

## 2024-11-12 DIAGNOSIS — I671 Cerebral aneurysm, nonruptured: Secondary | ICD-10-CM

## 2024-11-12 DIAGNOSIS — I959 Hypotension, unspecified: Secondary | ICD-10-CM | POA: Diagnosis present

## 2024-11-12 DIAGNOSIS — Z79890 Hormone replacement therapy (postmenopausal): Secondary | ICD-10-CM

## 2024-11-12 DIAGNOSIS — Z8673 Personal history of transient ischemic attack (TIA), and cerebral infarction without residual deficits: Secondary | ICD-10-CM

## 2024-11-12 DIAGNOSIS — Z79899 Other long term (current) drug therapy: Secondary | ICD-10-CM

## 2024-11-12 DIAGNOSIS — Z8262 Family history of osteoporosis: Secondary | ICD-10-CM

## 2024-11-12 DIAGNOSIS — E7800 Pure hypercholesterolemia, unspecified: Secondary | ICD-10-CM | POA: Diagnosis present

## 2024-11-12 DIAGNOSIS — I129 Hypertensive chronic kidney disease with stage 1 through stage 4 chronic kidney disease, or unspecified chronic kidney disease: Secondary | ICD-10-CM | POA: Diagnosis present

## 2024-11-12 DIAGNOSIS — I6523 Occlusion and stenosis of bilateral carotid arteries: Secondary | ICD-10-CM | POA: Diagnosis present

## 2024-11-12 DIAGNOSIS — Z8249 Family history of ischemic heart disease and other diseases of the circulatory system: Secondary | ICD-10-CM

## 2024-11-12 DIAGNOSIS — N189 Chronic kidney disease, unspecified: Secondary | ICD-10-CM

## 2024-11-12 DIAGNOSIS — Z885 Allergy status to narcotic agent status: Secondary | ICD-10-CM

## 2024-11-12 DIAGNOSIS — I4589 Other specified conduction disorders: Secondary | ICD-10-CM | POA: Diagnosis present

## 2024-11-12 DIAGNOSIS — Z888 Allergy status to other drugs, medicaments and biological substances status: Secondary | ICD-10-CM

## 2024-11-12 DIAGNOSIS — R42 Dizziness and giddiness: Secondary | ICD-10-CM

## 2024-11-12 DIAGNOSIS — D72829 Elevated white blood cell count, unspecified: Secondary | ICD-10-CM | POA: Diagnosis present

## 2024-11-12 DIAGNOSIS — N179 Acute kidney failure, unspecified: Secondary | ICD-10-CM | POA: Diagnosis present

## 2024-11-12 DIAGNOSIS — R269 Unspecified abnormalities of gait and mobility: Secondary | ICD-10-CM | POA: Diagnosis present

## 2024-11-12 DIAGNOSIS — N1831 Chronic kidney disease, stage 3a: Secondary | ICD-10-CM | POA: Diagnosis present

## 2024-11-12 DIAGNOSIS — E039 Hypothyroidism, unspecified: Secondary | ICD-10-CM | POA: Diagnosis present

## 2024-11-12 DIAGNOSIS — I63543 Cerebral infarction due to unspecified occlusion or stenosis of bilateral cerebellar arteries: Principal | ICD-10-CM | POA: Diagnosis present

## 2024-11-12 DIAGNOSIS — R001 Bradycardia, unspecified: Secondary | ICD-10-CM | POA: Diagnosis not present

## 2024-11-12 DIAGNOSIS — R531 Weakness: Secondary | ICD-10-CM

## 2024-11-12 LAB — CBC WITH DIFFERENTIAL/PLATELET
Abs Immature Granulocytes: 0.22 K/uL — ABNORMAL HIGH (ref 0.00–0.07)
Basophils Absolute: 0.1 K/uL (ref 0.0–0.1)
Basophils Relative: 1 %
Eosinophils Absolute: 0.1 K/uL (ref 0.0–0.5)
Eosinophils Relative: 1 %
HCT: 37.8 % (ref 36.0–46.0)
Hemoglobin: 12.3 g/dL (ref 12.0–15.0)
Immature Granulocytes: 1 %
Lymphocytes Relative: 13 %
Lymphs Abs: 2 K/uL (ref 0.7–4.0)
MCH: 31.5 pg (ref 26.0–34.0)
MCHC: 32.5 g/dL (ref 30.0–36.0)
MCV: 96.9 fL (ref 80.0–100.0)
Monocytes Absolute: 1.2 K/uL — ABNORMAL HIGH (ref 0.1–1.0)
Monocytes Relative: 7 %
Neutro Abs: 12.4 K/uL — ABNORMAL HIGH (ref 1.7–7.7)
Neutrophils Relative %: 77 %
Platelets: 225 K/uL (ref 150–400)
RBC: 3.9 MIL/uL (ref 3.87–5.11)
RDW: 13.1 % (ref 11.5–15.5)
WBC: 15.9 K/uL — ABNORMAL HIGH (ref 4.0–10.5)
nRBC: 0 % (ref 0.0–0.2)

## 2024-11-12 LAB — MAGNESIUM: Magnesium: 1.7 mg/dL (ref 1.7–2.4)

## 2024-11-12 LAB — COMPREHENSIVE METABOLIC PANEL WITH GFR
ALT: 9 U/L (ref 0–44)
AST: 25 U/L (ref 15–41)
Albumin: 3 g/dL — ABNORMAL LOW (ref 3.5–5.0)
Alkaline Phosphatase: 48 U/L (ref 38–126)
Anion gap: 13 (ref 5–15)
BUN: 26 mg/dL — ABNORMAL HIGH (ref 8–23)
CO2: 16 mmol/L — ABNORMAL LOW (ref 22–32)
Calcium: 8.9 mg/dL (ref 8.9–10.3)
Chloride: 106 mmol/L (ref 98–111)
Creatinine, Ser: 1.51 mg/dL — ABNORMAL HIGH (ref 0.44–1.00)
GFR, Estimated: 35 mL/min — ABNORMAL LOW (ref >60)
Glucose, Bld: 139 mg/dL — ABNORMAL HIGH (ref 70–99)
Potassium: 4.1 mmol/L (ref 3.5–5.1)
Sodium: 135 mmol/L (ref 135–145)
Total Bilirubin: 0.6 mg/dL (ref 0.0–1.2)
Total Protein: 5.7 g/dL — ABNORMAL LOW (ref 6.5–8.1)

## 2024-11-12 LAB — I-STAT CHEM 8, ED
BUN: 26 mg/dL — ABNORMAL HIGH (ref 8–23)
Calcium, Ion: 1.12 mmol/L — ABNORMAL LOW (ref 1.15–1.40)
Chloride: 109 mmol/L (ref 98–111)
Creatinine, Ser: 1.4 mg/dL — ABNORMAL HIGH (ref 0.44–1.00)
Glucose, Bld: 171 mg/dL — ABNORMAL HIGH (ref 70–99)
HCT: 37 % (ref 36.0–46.0)
Hemoglobin: 12.6 g/dL (ref 12.0–15.0)
Potassium: 4.2 mmol/L (ref 3.5–5.1)
Sodium: 137 mmol/L (ref 135–145)
TCO2: 16 mmol/L — ABNORMAL LOW (ref 22–32)

## 2024-11-12 LAB — CBG MONITORING, ED: Glucose-Capillary: 153 mg/dL — ABNORMAL HIGH (ref 70–99)

## 2024-11-12 LAB — TSH: TSH: 3.655 u[IU]/mL (ref 0.350–4.500)

## 2024-11-12 LAB — MRSA NEXT GEN BY PCR, NASAL: MRSA by PCR Next Gen: NOT DETECTED

## 2024-11-12 LAB — PROCALCITONIN: Procalcitonin: 0.68 ng/mL

## 2024-11-12 MED ORDER — SODIUM CHLORIDE 0.9 % IV SOLN: INTRAVENOUS | Status: AC

## 2024-11-12 MED ORDER — EPINEPHRINE HCL 5 MG/250ML IV SOLN IN NS
0.5000 ug/min | INTRAVENOUS | Status: DC
  Administered 2024-11-12: 10 ug/min via INTRAVENOUS
  Filled 2024-11-12: qty 250

## 2024-11-12 MED ORDER — ORAL CARE MOUTH RINSE: 15.0000 mL | OROMUCOSAL | Status: DC | PRN

## 2024-11-12 MED ORDER — ENSURE PLUS HIGH PROTEIN PO LIQD
237.0000 mL | Freq: Two times a day (BID) | ORAL | Status: DC
  Administered 2024-11-13 – 2024-11-14 (×3): 237 mL via ORAL

## 2024-11-12 MED ORDER — ACETAMINOPHEN 325 MG PO TABS
650.0000 mg | ORAL_TABLET | ORAL | Status: DC | PRN
  Administered 2024-11-13: 650 mg via ORAL
  Filled 2024-11-12: qty 2

## 2024-11-12 MED ORDER — LEVOTHYROXINE SODIUM 100 MCG PO TABS
100.0000 ug | ORAL_TABLET | Freq: Every day | ORAL | Status: DC
  Administered 2024-11-13 – 2024-11-15 (×3): 100 ug via ORAL
  Filled 2024-11-12 (×3): qty 1

## 2024-11-12 MED ORDER — ASPIRIN 300 MG RE SUPP
300.0000 mg | Freq: Once | RECTAL | Status: DC
Start: 2024-11-12 — End: 2024-11-13

## 2024-11-12 MED ORDER — CHLORHEXIDINE GLUCONATE CLOTH 2 % EX PADS
6.0000 | MEDICATED_PAD | Freq: Every day | CUTANEOUS | Status: DC
  Administered 2024-11-12 – 2024-11-14 (×3): 6 via TOPICAL

## 2024-11-12 MED ORDER — IOHEXOL 350 MG/ML SOLN
100.0000 mL | Freq: Once | INTRAVENOUS | Status: AC | PRN
  Administered 2024-11-12: 100 mL via INTRAVENOUS

## 2024-11-12 MED ORDER — ASPIRIN 325 MG PO TABS: 325.0000 mg | ORAL_TABLET | Freq: Once | ORAL | Status: DC

## 2024-11-12 MED ORDER — ONDANSETRON HCL 4 MG/2ML IJ SOLN
4.0000 mg | Freq: Four times a day (QID) | INTRAMUSCULAR | Status: DC | PRN
  Administered 2024-11-13 (×2): 4 mg via INTRAVENOUS
  Filled 2024-11-12 (×2): qty 2

## 2024-11-12 MED ORDER — ATORVASTATIN CALCIUM 10 MG PO TABS
10.0000 mg | ORAL_TABLET | Freq: Every day | ORAL | Status: DC
  Administered 2024-11-12: 10 mg via ORAL
  Filled 2024-11-12: qty 1

## 2024-11-12 MED ORDER — ATORVASTATIN CALCIUM 80 MG PO TABS
80.0000 mg | ORAL_TABLET | Freq: Every day | ORAL | Status: DC
  Administered 2024-11-13: 80 mg via ORAL
  Filled 2024-11-12: qty 1

## 2024-11-12 MED ORDER — SODIUM CHLORIDE 0.9 % IV BOLUS
1000.0000 mL | Freq: Once | INTRAVENOUS | Status: AC
  Administered 2024-11-12: 1000 mL via INTRAVENOUS

## 2024-11-12 MED ORDER — ASPIRIN 81 MG PO TBEC
81.0000 mg | DELAYED_RELEASE_TABLET | Freq: Every day | ORAL | Status: DC
  Administered 2024-11-13 – 2024-11-15 (×3): 81 mg via ORAL
  Filled 2024-11-12 (×3): qty 1

## 2024-11-12 NOTE — Code Documentation (Signed)
 Stroke Response Nurse Documentation Code Documentation  TEONNA COONAN is a 78 y.o. female arriving to [facility]  via [facility] on 11-12-24 with past medical hx of HTN, CKD, memory issues. On No antithrombotic. Code stroke was activated by ED.   Patient from home where she was thought to be LKW at 0930 and now complaining of left arm weakness and numbness, upon further conversation discovered that she has had these symptoms of and on for the past 2 weeks.  She has been intermittently dizzy for about 4 weeks.    Stroke team at the bedside on patient arrival. Labs drawn and patient cleared for CT by Dr. Garrick. Patient to CT with team. NIHSS 3, see documentation for details and code stroke times. Patient with left arm weakness, left limb ataxia, and dysarthria  on exam. The following imaging was completed:  CT Head, CTA, and CTP. Patient is not a candidate for IV Thrombolytic due to outside TNK window. Patient is not a candidate for IR due to no LVO on CTA.   Care Plan: VS and NIHSS q 2 hours x 12 hours then q 4 hours.   Patient arrived to ED about 1315 with nausea and vomiting and complains of dizziness.  She is in complete heart block and hypotensive.  While in CT Epinephrine  gtt started at 10mcg per MD order.    Bedside handoff with ED RN Annitta.    Elvin Portland  Stroke Response RN

## 2024-11-12 NOTE — ED Notes (Signed)
 Cardiology at bedside and wanting to activate a Code Stroke on the patient -ED secretary calling [facility] for same.

## 2024-11-12 NOTE — Progress Notes (Addendum)
 This RN was given report by ED RN at [phone]. This RN was asked to meet in MRI so I could take the patient from there to 2H17. Patient was transported with no immediate issues. When patient arrived to 2H17 this RN notified the MD of her arrival. A verbal order was given from the MD to discontinue airborne/contact precautions and to turn the epinephrine  drip off due to hypertension. The patient's heart rate remained 70-80 BPM and her blood pressure remained stable. Patient's family at bedside.       Vertell Pellant, RN

## 2024-11-12 NOTE — Consult Note (Addendum)
 NEUROLOGY CONSULT NOTE   Date of service: November 12, 2024 Patient Name: Rose Garza MRN:  [id number] DOB:  [birth year] Chief Complaint: Code stroke Requesting Provider: Garrick Charleston, MD  History of Present Illness  Rose Garza is a 78 y.o. female with hx of memory loss, hypothyroidism, HTN, CKD 3a presenting with dizziness. She stated tahat she has been dizzy with room spinning and head heavy for the last 4 weeks. It comes and goes, not sure about triggers. She was in church this morning, felt dizzy again, she went to bathroom and was nausea and vomited. One of her church friends was RN and checked her HR 40s and BP 78/52. EMS was called and she was sent to ER for evaluation.  En route she received 500 cc of fluid, Zofran , and atropine.  On arrival to the ED she was again found to be hypotensive77/46 and bradycardic at 39 with a complete heart block. She was given another liter of fluids with improvement of blood pressure systolic to 90 and heart rate around 35.  Cardiology noted acute left arm weakness and drift and a code stroke was activated. She again reported fatigue, nausea, and dizziness.  The symptoms do get worse with movement and she did have 2 episodes of dry heaving while in CT.  In reviewing her records from cardiology she is on verapamil 240 mg twice daily.  Previous EKGs noted in her cardiology notes do show her baseline heart rate between 55 and 64 bpm.  She does follow with [facility] cardiology and audiology.  She denies a history of stroke.  Epinephrine  also started and BP much improved and patient symptoms symptoms are improved and left-sided weakness largely resolved.  CT showed age indeterminate right PICA infarct as well as remote left PICA infarct and remote bilateral thalamic lacunar infarcts.  CTA head and neck no LVO, but atherosclerosis bilateral ICA bulb.  Left ICA terminus aneurysm 4 mm.   LKW: 4 weeks ago Modified rankin score:  0-Completely asymptomatic and back to baseline post- stroke IV Thrombolysis: No, outside window and rapid improvement EVT: No, no LVO  NIHSS components Score: Comment  1a Level of Conscious 0[]  1[]  2[]  3[]      1b LOC Questions 0[]  1[]  2[]       1c LOC Commands 0[]  1[]  2[]       2 Best Gaze 0[]  1[]  2[]       3 Visual 0[]  1[]  2[]  3[]      4 Facial Palsy 0[]  1[]  2[]  3[]      5a Motor Arm - left 0[]  1[x]  2[]  3[]  4[]  UN[]    5b Motor Arm - Right 0[]  1[]  2[]  3[]  4[]  UN[]    6a Motor Leg - Left 0[]  1[]  2[]  3[]  4[]  UN[]    6b Motor Leg - Right 0[]  1[]  2[]  3[]  4[]  UN[]    7 Limb Ataxia 0[]  1[x]  2[]  UN[]      8 Sensory 0[]  1[]  2[]  UN[]      9 Best Language 0[]  1[]  2[]  3[]      10 Dysarthria 0[]  1[x]  2[]  UN[]      11 Extinct. and Inattention 0[]  1[]  2[]       TOTAL:       ROS  Comprehensive ROS performed and pertinent positives documented in HPI   Past History   Past Medical History:  Diagnosis Date   Atrophic vaginitis    Dyspareunia    Fibroid    Hematuria    negative workup   Hypercholesteremia  Hypertension    Kidney stones    Migraines    MVP (mitral valve prolapse)    Osteopenia of femoral neck 06/2015   left hip - 2.3.  on fosamax    Past Surgical History:  Procedure Laterality Date   ABDOMINAL HYSTERECTOMY  04/1984    secondary to fibroids.  Ovaries remain   ANKLE SURGERY Left 2010   CESAREAN SECTION     times 2   DILATION AND CURETTAGE OF UTERUS     HEMORRHOID BANDING     TEMPOROMANDIBULAR JOINT SURGERY  1985    Family History: Family History  Problem Relation Age of Onset   Breast cancer Mother 80   Heart disease Mother    Osteoporosis Mother    Breast cancer Maternal Aunt    Breast cancer Maternal Aunt    Breast cancer Maternal Aunt     Social History  reports that she has never smoked. She has never used smokeless tobacco. She reports that she does not drink alcohol and does not use drugs.  Allergies  Allergen Reactions   Bactrim  Ds  [Sulfamethoxazole -Trimethoprim ] Nausea Only and Swelling   Codeine Nausea And Vomiting   Xanax [Alprazolam]     headache    Medications   Current Facility-Administered Medications:    [COMPLETED] sodium chloride  0.9 % bolus 1,000 mL, 1,000 mL, Intravenous, Once, Last Rate: 999 mL/hr at 11/12/24 1331, 1,000 mL at 11/12/24 1331 **AND** 0.9 %  sodium chloride  infusion, , Intravenous, Continuous, Garrick Charleston, MD  Current Outpatient Medications:    atorvastatin  (LIPITOR) 10 MG tablet, Take 10 mg by mouth daily., Disp: , Rfl:    Cholecalciferol (VITAMIN D PO), Take 1,000 Int'l Units by mouth daily. , Disp: , Rfl:    citalopram  (CELEXA ) 20 MG tablet, Take 20 mg by mouth daily., Disp: , Rfl:    nitrofurantoin  (MACRODANTIN ) 100 MG capsule, , Disp: , Rfl:    nitrofurantoin , macrocrystal-monohydrate, (MACROBID ) 100 MG capsule, Take 1 capsule (100 mg total) by mouth 2 (two) times daily. Take one capsule BID x 10 days, Disp: 20 capsule, Rfl: 0   raloxifene (EVISTA) 60 MG tablet, , Disp: , Rfl:    SYNTHROID  100 MCG tablet, , Disp: , Rfl:    verapamil (CALAN-SR) 240 MG CR tablet, , Disp: , Rfl:   Vitals   Vitals:   11/12/24 1330 11/12/24 1345 11/12/24 1400 11/12/24 1415  BP: (!) 79/44 (!) 78/46 (!) 100/44 95/80  Pulse: (!) 36 (!) 36 (!) 36 (!) 34  Resp: 16 17 14 16   Temp:      TempSrc:      SpO2: 99% 100% 100% 100%  Weight:      Height:        Body mass index is 27.64 kg/m.   Physical Exam   Constitutional: Appears well-developed and well-nourished.  Psych: Affect appropriate to situation.  Eyes: No scleral injection.  HENT: No OP obstruction.  Head: Normocephalic.  Cardiovascular: Bradycardic Respiratory: GI: Soft.  No distension. There is no tenderness.  Skin: WDI.   Neurologic Examination    Neuro: Mental Status: Patient is awake, lethargic.  She is oriented x 3.  Her speech is dysarthric with shivering (cold).  She is having some confusion and is not able to give a  coherent history. Cranial Nerves: II: Visual Fields are full. Pupils are equal, round, and reactive to light.   III,IV, VI: EOMI without ptosis or diploplia.  V: Facial sensation is symmetric to temperature VII: Facial movement is  symmetric resting and smiling VIII: Hearing is intact to voice X: Palate elevates symmetrically XI: Shoulder shrug is symmetric. XII: Tongue protrudes midline without atrophy or fasciculations.  Motor: Tone is normal. Bulk is normal.  Right arm full strength Left arm 3/5 with drift but with later exam after BP improved, no drift noticed Left leg with full strength, right leg with full strength Sensory: Sensation is symmetric to light touch and temperature in the arms and legs. No extinction to DSS present.  She does endorse some numbness in her fingertips on the left hand Cerebellar: Mild ataxia noted with left upper extremity with FTN, but shivering due to cold.    Labs/Imaging/Neurodiagnostic studies   CBC:  Recent Labs  Lab 11-29-2024 1326 2024/11/29 1335  WBC 15.9*  --   NEUTROABS 12.4*  --   HGB 12.3 12.6  HCT 37.8 37.0  MCV 96.9  --   PLT 225  --    Basic Metabolic Panel:  Lab Results  Component Value Date   NA 137 2024/11/29   K 4.2 Nov 29, 2024   CO2 16 (L) 29-Nov-2024   GLUCOSE 171 (H) 29-Nov-2024   BUN 26 (H) 2024/11/29   CREATININE 1.40 (H) 11/29/2024   CALCIUM  8.9 2024/11/29   GFRNONAA 35 (L) 29-Nov-2024   GFRAA  12/02/2009    >60        The eGFR has been calculated using the MDRD equation. This calculation has not been validated in all clinical situations. eGFR's persistently <60 mL/min signify possible Chronic Kidney Disease.   Lipid Panel: No results found for: LDLCALC HgbA1c: No results found for: HGBA1C Urine Drug Screen: No results found for: LABOPIA, COCAINSCRNUR, LABBENZ, AMPHETMU, THCU, LABBARB  Alcohol Level No results found for: [facility] INR  Lab Results  Component Value Date   INR 1.00 12/02/2009    APTT No results found for: APTT AED levels: No results found for: PHENYTOIN, ZONISAMIDE, LAMOTRIGINE, LEVETIRACETA  CT Head without contrast(Personally reviewed): 1. Age-indeterminate inferior right cerebellar infarct 2. Remote posterior inferior left cerebellar infarcts. 3. Aspects: 10. 4. Advanced confluent white matter disease bilaterally. 5. Remote bilateral thalamic lacunar infarcts.  CT angio Head and Neck with contrast(Personally reviewed): 1. No acute large vessel occlusion. 2. Perfusion study is nondiagnostic for potential ischemia due to significant patient motion artifact. No core infarct is suggested. 3. Atherosclerotic calcifications within the carotid bifurcations and proximal left ICA bilaterally without significant stenosis. 4. 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  ASSESSMENT   TANICKA BISAILLON is a 78 y.o. female x of memory loss, hypothyroidism, HTN, CKD 3a presenting with dizziness on and off for 4 weeks and then was found to have left upper extremity weakness in ER during cardiology evaluation and a code stroke was activated. She has gotten 1.5 L of normal saline bolus.  Blood pressure systolic between 70 and 90 and heart rate around 35.  At the be was started in CT with improvement of her heart rate to the mid 40s and her blood pressure systolic in the 90s.  Per cardiology MAP goal greater than 70.  Put on epinephrine  and BP much improved.  On recheck, patient left arm weakness much improved without drift.  Plan to admit to cardiology 2H for possible temporary pacing.  We will attempt to get an MRI prior to her going into temporary pacing wires which are typically not MRI compatible and if she does end up with a permanent pacemaker she will not be able  to get an MRI for approximately 6 weeks.  Her CT head does show a bilateral cerebellar infarct that does explain her current symptoms and given her blood pressure and heart  rate on admission it would not be surprising for her to of had a stroke in this area.  If she is unable to get an MRI we will repeat head CT in about 24 hours.  RECOMMENDATIONS  - HgbA1c, fasting lipid panel - MRI of the brain without contrast - Frequent neuro checks - Echocardiogram - Antiplatelet therapy - ASA load with 325mg  followed by ASA 81mg  with further recommendations after MRI  - Risk factor modification - Telemetry monitoring - PT consult, OT consult, Speech consult - Bradycardia management per cardiology - Stroke team will follow  ______________________________________________________________________    Signed, Jorene Last, NP [facility]  ATTENDING NOTE: I reviewed above note and agree with the assessment and plan. Pt was seen and examined. I discussed with pt family at bedside. Pt symptoms seem improved after BP improved after epinephrine . Will recommend to keep SBP more than 100, with goal 110-130. Discussed with cardiology.  Pending MRI.  Okay with aspirin  now.  Will follow.  For detailed assessment and plan, please refer to above as I have made changes wherever appropriate.   Ary Cummins, MD PhD Stroke Neurology 11/12/2024 7:18 PM

## 2024-11-12 NOTE — ED Provider Notes (Signed)
 [facility] EMERGENCY DEPARTMENT AT [facility] Provider Note   CSN: [id number] Arrival date & time: 11/12/24  1315     Patient presents with: Dizziness   Rose Garza is a 78 y.o. female.   HPI Patient presents with weakness via EMS.  Patient with dizziness, began at church, no complete fall, though near syncope, and weakness prompted EMS notification. There was associate nausea, vomiting, and EMS reports she was hypotensive on the scene.  No beta-blocker use, no obvious precipitant.  Pt arrives via EMS from home with reports of dizziness for a few hours that worsened today. Hx of vertigo. Endorses n/v, hypotensive in 80s. Pt dropped to 60s on arrival to ER. Denies CP. Intermittent SOB.  EMS gave 500 cc NS, 4mg  zofran  and 1 mg atropine     Prior to Admission medications   Medication Sig Start Date End Date Taking? Authorizing Provider  atorvastatin  (LIPITOR) 10 MG tablet Take 10 mg by mouth daily.    [provider]  Cholecalciferol (VITAMIN D PO) Take 1,000 Int'l Units by mouth daily.     [provider]  citalopram  (CELEXA ) 20 MG tablet Take 20 mg by mouth daily.    [provider]  nitrofurantoin  (MACRODANTIN ) 100 MG capsule  08/22/19   [provider]  nitrofurantoin , macrocrystal-monohydrate, (MACROBID ) 100 MG capsule Take 1 capsule (100 mg total) by mouth 2 (two) times daily. Take one capsule BID x 10 days 07/28/18   Rodgers Barnie RAMAN, CNM  raloxifene (EVISTA) 60 MG tablet  07/02/15   [provider]  SYNTHROID  100 MCG tablet  07/18/15   [provider]  verapamil (CALAN-SR) 240 MG CR tablet  05/22/15   [provider]    Allergies: Bactrim  ds [sulfamethoxazole -trimethoprim ], Codeine, and Xanax [alprazolam]    Review of Systems  Updated Vital Signs BP (!) 79/44   Pulse (!) 36   Temp (!) 97.5 F (36.4 C) (Temporal)   Resp 16   Ht 1.568 m (5' 1.75)   Wt 68 kg   LMP 12/21/1986 (Approximate)    SpO2 99%   BMI 27.64 kg/m   Physical Exam Vitals and nursing note reviewed.  Constitutional:      General: She is not in acute distress.    Appearance: She is well-developed.  HENT:     Head: Normocephalic and atraumatic.  Eyes:     Conjunctiva/sclera: Conjunctivae normal.  Cardiovascular:     Rate and Rhythm: Regular rhythm. Bradycardia present.  Pulmonary:     Effort: Pulmonary effort is normal. No respiratory distress.     Breath sounds: No stridor.  Abdominal:     General: There is no distension.  Skin:    General: Skin is warm and dry.  Neurological:     Mental Status: She is alert and oriented to person, place, and time.     Cranial Nerves: No cranial nerve deficit.  Psychiatric:        Mood and Affect: Mood normal.     (all labs ordered are listed, but only abnormal results are displayed) Labs Reviewed  CBC WITH DIFFERENTIAL/PLATELET - Abnormal; Notable for the following components:      Result Value   WBC 15.9 (*)    Neutro Abs 12.4 (*)    Monocytes Absolute 1.2 (*)    Abs Immature Granulocytes 0.22 (*)    All other components within normal limits  CBG MONITORING, ED - Abnormal; Notable for the following components:   Glucose-Capillary 153 (*)  All other components within normal limits  I-STAT CHEM 8, ED - Abnormal; Notable for the following components:   BUN 26 (*)    Creatinine, Ser 1.40 (*)    Glucose, Bld 171 (*)    Calcium , Ion 1.12 (*)    TCO2 16 (*)    All other components within normal limits  COMPREHENSIVE METABOLIC PANEL WITH GFR    EKG: None  Radiology: [facility] Chest Port 1 View Result Date: 11/12/2024 CLINICAL DATA:  Dizziness. EXAM: PORTABLE CHEST 1 VIEW COMPARISON:  Chest radiograph dated 02/23/2015. FINDINGS: No focal consolidation, pleural effusion, or pneumothorax. Stable cardiac silhouette. No acute osseous pathology. IMPRESSION: No active disease. Electronically Signed   By: Vanetta Chou M.D.   On: 11/12/2024 13:46      Procedures   Medications Ordered in the ED  sodium chloride  0.9 % bolus 1,000 mL (1,000 mLs Intravenous New Bag/Given 11/12/24 1331)    And  0.9 %  sodium chloride  infusion (has no administration in time range)                                    Medical Decision Making Patient with nausea, vomiting, weakness, lightheadedness.  I was called to EMS gurney as they arrived in the facility, on monitor patient with evidence for complete heart block, pacer pads applied, fluids, monitoring commenced. Cardiac monitor 40s, escape rhythm, abnormal Pulse ox 99% room air normal.  Amount and/or Complexity of Data Reviewed Independent Historian: EMS Labs: ordered. Decision-making details documented in ED Course. Radiology: ordered and independent interpretation performed. Decision-making details documented in ED Course.  Risk Prescription drug management. Decision regarding hospitalization.   1:58 PM Blood pressure now improved, 80 systolic, patient awake and alert, but remains bradycardic, 30s, 40s, ventricular escape rhythm. Pacer pads in place. I discussed her case with our cardiology colleagues.   Update after my brief assessment, I was called to bedside for another patient for cardioversion, cardiology stat assessment notable for both dizziness, bradycardia but also questionable left arm ataxia, patient designated as a code stroke. Subsequently discussed patient's case again with the cardiology colleagues, neurology colleagues.  Prior stroke on CT noted, neurology following.   We discussed the patient's medication regimen she is on substantial doses of verapamil, has been seen primarily at a different healthcare facility, now, with concern for heart block, patient admitted to our cardiology colleagues.  CRITICAL CARE Performed by: Lamar Salen Total critical care time: 35 minutes Critical care time was exclusive of separately billable procedures and treating other  patients. Critical care was necessary to treat or prevent imminent or life-threatening deterioration. Critical care was time spent personally by me on the following activities: development of treatment plan with patient and/or surrogate as well as nursing, discussions with consultants, evaluation of patient's response to treatment, examination of patient, obtaining history from patient or surrogate, ordering and performing treatments and interventions, ordering and review of laboratory studies, ordering and review of radiographic studies, pulse oximetry and re-evaluation of patient's condition.   Final diagnoses:  Complete heart block (HCC)  Weakness    ED Discharge Orders     None          Salen Lamar, MD 11/12/24 1558

## 2024-11-12 NOTE — ED Triage Notes (Signed)
 Pt arrives via EMS from home with reports of dizziness for a few hours that worsened today. Hx of vertigo. Endorses n/v, hypotensive in 80s. Pt dropped to 60s on arrival to ER. Denies CP. Intermittent SOB.  EMS gave 500 cc NS, 4mg  zofran  and 1 mg atropine

## 2024-11-12 NOTE — H&P (Addendum)
 Cardiology Admission History and Physical   Patient ID: Rose Garza MRN: [id number]; DOB: 1946/10/21   Admission date: 11/12/2024  PCP:  Loreli Kins, MD   [facility] Providers Cardiologist:  None   {follows with [facility]  Chief Complaint:  Complete Heart block  Patient Profile: Rose Garza is a 78 y.o. female with hypertension,CKD Stage 3a, hypothyroidism, hereditary hemochromatosis and memory change who is being seen 11/12/2024 for the evaluation of complete heart block.  History of Present Illness: Ms. Lown follows with [facility] for her mild valvular disease. Outside echo 12/2023 showed LVEF 55-58%  with mild AI. Trivial pericardial effusion. On chart review at [facility] cardiology it appears patient takes verapamil 240 mg BID per OSH for blood pressure control. Patient confirmed this.  Presenting to the ED on 11/23 for dizziness with nausea and emesis. She was found to be hypotensive with systolic dropping to the 60s en route via EMS. She was given IV fluids, zofran , and 1 mg atropine. No spreadsheet in media tab.  On arrival to ED: BP: 77/46  HR 39 ECG sinus rhythm with AV dissociation with junctional escape VR 40, chronicity of LBBB unclear CXR without active disease  Pertinent lab work Cr 1.5  Albumin 3.0 WBC 15.9 with left shift  She has been given IV fluids in the ED.   On interview, she was at church when she had sudden onset of whoozy feeling with nausea and emesis. She has noticed some wooziness the past several days.  Denied chest pain or shortness of breath. Does note having a cough, though does not think she has been sick   Family noted she has been acting off the past couple of weeks and more notable since last night/this morning. Patient reported she has been having intermittent left arm numbness and weakness that comes and goes. Family has noticed a gait disturbance.   Past Medical History:  Diagnosis Date    Atrophic vaginitis    Dyspareunia    Fibroid    Hematuria    negative workup   Hypercholesteremia    Hypertension    Kidney stones    Migraines    MVP (mitral valve prolapse)    Osteopenia of femoral neck 06/2015   left hip - 2.3.  on fosamax   Past Surgical History:  Procedure Laterality Date   ABDOMINAL HYSTERECTOMY  04/1984    secondary to fibroids.  Ovaries remain   ANKLE SURGERY Left 2010   CESAREAN SECTION     times 2   DILATION AND CURETTAGE OF UTERUS     HEMORRHOID BANDING     TEMPOROMANDIBULAR JOINT SURGERY  1985     Medications Prior to Admission: Prior to Admission medications   Medication Sig Start Date End Date Taking? Authorizing Provider  atorvastatin  (LIPITOR) 10 MG tablet Take 10 mg by mouth daily.    [provider]  Cholecalciferol (VITAMIN D PO) Take 1,000 Int'l Units by mouth daily.     [provider]  citalopram  (CELEXA ) 20 MG tablet Take 20 mg by mouth daily.    [provider]  nitrofurantoin  (MACRODANTIN ) 100 MG capsule  08/22/19   [provider]  nitrofurantoin , macrocrystal-monohydrate, (MACROBID ) 100 MG capsule Take 1 capsule (100 mg total) by mouth 2 (two) times daily. Take one capsule BID x 10 days 07/28/18   Rodgers Barnie RAMAN, CNM  raloxifene (EVISTA) 60 MG tablet  07/02/15   [provider]  SYNTHROID  100 MCG tablet  07/18/15  [provider]  verapamil (CALAN-SR) 240 MG CR tablet  05/22/15   [provider]     Allergies:    Allergies  Allergen Reactions   Bactrim  Ds [Sulfamethoxazole -Trimethoprim ] Nausea Only and Swelling   Codeine Nausea And Vomiting   Xanax [Alprazolam]     headache    Social History:   Social History   Socioeconomic History   Marital status: Widowed    Spouse name: Not on file   Number of children: Not on file   Years of education: Not on file   Highest education level: Not on file  Occupational History   Not on file  Tobacco Use   Smoking status:  Never   Smokeless tobacco: Never  Substance and Sexual Activity   Alcohol use: No   Drug use: No   Sexual activity: Not Currently    Partners: Male    Birth control/protection: Surgical    Comment: TAH  Other Topics Concern   Not on file  Social History Narrative   Husband passed 09/2016 from heart disease.   Social Drivers of Corporate Investment Banker Strain: Low Risk  (10/18/2024)   Received from [facility]   Overall Financial Resource Strain (CARDIA)    How hard is it for you to pay for the very basics like food, housing, medical care, and heating?: Not hard at all  Food Insecurity: No Food Insecurity (10/18/2024)   Received from [facility]   Hunger Vital Sign    Within the past 12 months, you worried that your food would run out before you got the money to buy more.: Never true    Within the past 12 months, the food you bought just didn't last and you didn't have money to get more.: Never true  Transportation Needs: No Transportation Needs (10/18/2024)   Received from [facility] - Transportation    In the past 12 months, has lack of transportation kept you from medical appointments or from getting medications?: No    In the past 12 months, has lack of transportation kept you from meetings, work, or from getting things needed for daily living?: No  Physical Activity: Inactive (10/18/2024)   Received from [facility]   Exercise Vital Sign    On average, how many days per week do you engage in moderate to strenuous exercise (like a brisk walk)?: 0 days    Minutes of Exercise per Session: Not on file  Stress: No Stress Concern Present (10/18/2024)   Received from [facility] - Occupational Stress Questionnaire    Do you feel stress - tense, restless, nervous, or anxious, or unable to sleep at night because your mind is troubled all the time - these days?: Not at all  Social Connections: Socially Integrated  (10/18/2024)   Received from [facility]   Social Network    How would you rate your social network (family, work, friends)?: Good participation with social networks  Intimate Partner Violence: Not At Risk (10/18/2024)   Received from [facility]   HITS    Over the last 12 months how often did your partner physically hurt you?: Never    Over the last 12 months how often did your partner insult you or talk down to you?: Never    Over the last 12 months how often did your partner threaten you with physical harm?: Never    Over the last 12 months how often did  your partner scream or curse at you?: Never     Family History:   The patient's family history includes Breast cancer in her maternal aunt, maternal aunt, and maternal aunt; Breast cancer (age of onset: 46) in her mother; Heart disease in her mother; Osteoporosis in her mother.    ROS:  Please see the history of present illness.  All other ROS reviewed and negative.     Physical Exam/Data: Vitals:   11/12/24 1320 11/12/24 1323 11/12/24 1330  BP: (!) 77/46  (!) 79/44  Pulse: (!) 39  (!) 36  Resp: 18  16  Temp: (!) 97.5 F (36.4 C)    TempSrc: Temporal    SpO2:   99%  Weight:  68 kg   Height:  5' 1.75 (1.568 m)    No intake or output data in the 24 hours ending 11/12/24 1400    11/12/2024    1:23 PM 04/29/2021    9:30 AM 07/14/2019   10:00 AM  Last 3 Weights  Weight (lbs) 149 lb 14.6 oz 150 lb 159 lb  Weight (kg) 68 kg 68.04 kg 72.122 kg     Body mass index is 27.64 kg/m.  General:  Pleasant older woman in no acute distress sitting upright in bed with [place] in place HEENT: normal facial movements Vascular: Distal pulses 2+ bilaterally   Cardiac:  Bradycardic Lungs:  Breathing comfortably on [place] Musculoskeletal:  Notable cold left hand with decreased motor function, normal lower extremity function Skin: warm and dry  Neuro:  motor and sensation defect to left arm with slurring of speech, no facial movement  defect Psych:  Normal affect   EKG:  See in HPI  Relevant CV Studies: OSH Echocardiogram 01/12/24 Left Ventricle: Systolic function is low normal. EF: 55-58%.    Left Ventricle: Doppler parameters indicate normal diastolic function.    Left Ventricle: Wall motion is normal.    Mitral Valve: Mitral valve structure is normal. The leaflets are mildly  thickened and exhibit normal excursion.    Aortic Valve: Mild aortic valve regurgitation.    Aortic Valve: The aortic valve is tricuspid. The leaflets are mildly  thickened and exhibit normal excursion. The leaflets are mildly calcified.    Tricuspid Valve: The right ventricular systolic pressure is normal (<36  mmHg).   Pericardium: There is a trivial pericardial effusion noted anterior to  the heart at basal segment of RV small segment of right atrium next to  tricuspid valve annulus.   Laboratory Data:   Chemistry Recent Labs  Lab 11/12/24 1326 11/12/24 1335  NA 135 137  K 4.1 4.2  CL 106 109  CO2 16*  --   GLUCOSE 139* 171*  BUN 26* 26*  CREATININE 1.51* 1.40*  CALCIUM  8.9  --   GFRNONAA 35*  --   ANIONGAP 13  --     Recent Labs  Lab 11/12/24 1326  PROT 5.7*  ALBUMIN 3.0*  AST 25  ALT 9  ALKPHOS 48  BILITOT 0.6   Hematology Recent Labs  Lab 11/12/24 1326 11/12/24 1335  WBC 15.9*  --   RBC 3.90  --   HGB 12.3 12.6  HCT 37.8 37.0  MCV 96.9  --   MCH 31.5  --   MCHC 32.5  --   RDW 13.1  --   PLT 225  --     Radiology/Studies:  DG Chest Port 1 View Result Date: 11/12/2024 CLINICAL DATA:  Dizziness. EXAM: PORTABLE CHEST 1 VIEW COMPARISON:  Chest radiograph dated 02/23/2015. FINDINGS: No focal consolidation, pleural effusion, or pneumothorax. Stable cardiac silhouette. No acute osseous pathology. IMPRESSION: No active disease. Electronically Signed   By: Vanetta Chou M.D.   On: 11/12/2024 13:46     Assessment and Plan:  Complete Heart Block Patient presenting with dizziness with nausea and emesis.   Ecg shows CHB with HR 35. She did require one dose of atropine in route with EMS. BP has been low though no aggressive measures have been needed.  Will order echocardiogram   Will need to let PTA CCB wash out, though suspect this will not change overall rhythm.  Will hold on dopamine or TVP at this time, consider if she becomes unstable.  Will obtain 2H bed for further management and pending rhythm will most likely pursue PPM on Wednesday Will order TSH  Neurologic Deficits Family and patient reported several weeks of gait disturbance, behavior change and intermittent left arm numbness. During interview patient noted to have left arm sensation and motor change with speech slurring. Code Stroke called.  Neurology consulted, appreciate recommendations.   Leukocytosis CXR without evidence of PNA She has remained afebrile Will order respiratory panel, blood cultures and UA on admission.   Hypotension Most likely 22 CHB as above, though will continue to investigate leukocytosis as described above  Hypothyroidism Continue home synthriod  CKD Cr 10/18/24 was 1.14 Cr today 1.5 Will continue to monitor  Hereditary hemochromatosis Continue outpatient phlebotomy    Risk Assessment/Risk Scores:        Code Status: Full Code  Severity of Illness: The appropriate patient status for this patient is INPATIENT. Inpatient status is judged to be reasonable and necessary in order to provide the required intensity of service to ensure the patient's safety. The patient's presenting symptoms, physical exam findings, and initial radiographic and laboratory data in the context of their chronic comorbidities is felt to place them at high risk for further clinical deterioration. Furthermore, it is not anticipated that the patient will be medically stable for discharge from the hospital within 2 midnights of admission.   * I certify that at the point of admission it is my clinical judgment that the  patient will require inpatient hospital care spanning beyond 2 midnights from the point of admission due to high intensity of service, high risk for further deterioration and high frequency of surveillance required.*  For questions or updates, please contact [facility] Please consult [email or web address] for contact info under       Signed, Leontine LOISE Salen, PA-C  11/12/2024 2:00 PM

## 2024-11-13 ENCOUNTER — Inpatient Hospital Stay (HOSPITAL_COMMUNITY)

## 2024-11-13 DIAGNOSIS — I69398 Other sequelae of cerebral infarction: Secondary | ICD-10-CM | POA: Diagnosis not present

## 2024-11-13 DIAGNOSIS — E785 Hyperlipidemia, unspecified: Secondary | ICD-10-CM

## 2024-11-13 DIAGNOSIS — I639 Cerebral infarction, unspecified: Secondary | ICD-10-CM | POA: Diagnosis not present

## 2024-11-13 DIAGNOSIS — I69334 Monoplegia of upper limb following cerebral infarction affecting left non-dominant side: Secondary | ICD-10-CM

## 2024-11-13 DIAGNOSIS — R42 Dizziness and giddiness: Secondary | ICD-10-CM

## 2024-11-13 DIAGNOSIS — I442 Atrioventricular block, complete: Secondary | ICD-10-CM

## 2024-11-13 LAB — ECHOCARDIOGRAM COMPLETE
Height: 61.75 in
MV M vel: 5.73 m/s
MV Peak grad: 131.3 mmHg
P 1/2 time: 363 ms
S' Lateral: 2.9 cm
Weight: 1996.49 [oz_av]

## 2024-11-13 LAB — HEMOGLOBIN A1C
Hgb A1c MFr Bld: 5.2 % (ref 4.8–5.6)
Mean Plasma Glucose: 103 mg/dL

## 2024-11-13 LAB — CBC
HCT: 34.9 % — ABNORMAL LOW (ref 36.0–46.0)
Hemoglobin: 11.5 g/dL — ABNORMAL LOW (ref 12.0–15.0)
MCH: 31.4 pg (ref 26.0–34.0)
MCHC: 33 g/dL (ref 30.0–36.0)
MCV: 95.4 fL (ref 80.0–100.0)
Platelets: 182 K/uL (ref 150–400)
RBC: 3.66 MIL/uL — ABNORMAL LOW (ref 3.87–5.11)
RDW: 13.1 % (ref 11.5–15.5)
WBC: 11.2 K/uL — ABNORMAL HIGH (ref 4.0–10.5)
nRBC: 0 % (ref 0.0–0.2)

## 2024-11-13 LAB — BASIC METABOLIC PANEL WITH GFR
Anion gap: 5 (ref 5–15)
BUN: 21 mg/dL (ref 8–23)
CO2: 23 mmol/L (ref 22–32)
Calcium: 8.2 mg/dL — ABNORMAL LOW (ref 8.9–10.3)
Chloride: 110 mmol/L (ref 98–111)
Creatinine, Ser: 1.07 mg/dL — ABNORMAL HIGH (ref 0.44–1.00)
GFR, Estimated: 53 mL/min — ABNORMAL LOW (ref >60)
Glucose, Bld: 81 mg/dL (ref 70–99)
Potassium: 4 mmol/L (ref 3.5–5.1)
Sodium: 138 mmol/L (ref 135–145)

## 2024-11-13 MED ORDER — CITALOPRAM HYDROBROMIDE 20 MG PO TABS
20.0000 mg | ORAL_TABLET | Freq: Every day | ORAL | Status: DC
  Administered 2024-11-13 – 2024-11-15 (×3): 20 mg via ORAL
  Filled 2024-11-13 (×3): qty 1

## 2024-11-13 MED ORDER — CITALOPRAM HYDROBROMIDE 20 MG PO TABS: 10.0000 mg | ORAL_TABLET | Freq: Every day | ORAL | Status: DC

## 2024-11-13 MED ORDER — CLOPIDOGREL BISULFATE 75 MG PO TABS
75.0000 mg | ORAL_TABLET | Freq: Every day | ORAL | Status: DC
  Administered 2024-11-13 – 2024-11-15 (×3): 75 mg via ORAL
  Filled 2024-11-13 (×3): qty 1

## 2024-11-13 MED ORDER — LOSARTAN POTASSIUM 25 MG PO TABS
50.0000 mg | ORAL_TABLET | Freq: Every day | ORAL | Status: DC
  Administered 2024-11-13: 50 mg via ORAL
  Filled 2024-11-13: qty 2

## 2024-11-13 NOTE — Progress Notes (Addendum)
 STROKE TEAM PROGRESS NOTE   SUBJECTIVE (INTERVAL HISTORY) Her son and daughter are at the bedside.  Overall her condition is rapidly improving. Her HR is in 90s and BP in 140s. She feels good now but still concerned about vertigo with head motion and working with PT/OT. No significant ataxia now.    OBJECTIVE Temp:  [97.5 F (36.4 C)-99.7 F (37.6 C)] 99.7 F (37.6 C) (11/24 1149) Pulse Rate:  [32-96] 91 (11/24 0715) Cardiac Rhythm: Normal sinus rhythm (11/23 2000) Resp:  [14-29] 16 (11/24 0715) BP: (77-173)/(44-139) 150/74 (11/24 0715) SpO2:  [90 %-100 %] 97 % (11/24 0715) Weight:  [56.6 kg-68 kg] 56.6 kg (11/24 0500)  Recent Labs  Lab 11/12/24 1336  GLUCAP 153*   Recent Labs  Lab 11/12/24 1326 11/12/24 1335 11/12/24 1818 11/13/24 0127  NA 135 137  --  138  K 4.1 4.2  --  4.0  CL 106 109  --  110  CO2 16*  --   --  23  GLUCOSE 139* 171*  --  81  BUN 26* 26*  --  21  CREATININE 1.51* 1.40*  --  1.07*  CALCIUM  8.9  --   --  8.2*  MG  --   --  1.7  --    Recent Labs  Lab 11/12/24 1326  AST 25  ALT 9  ALKPHOS 48  BILITOT 0.6  PROT 5.7*  ALBUMIN 3.0*   Recent Labs  Lab 11/12/24 1326 11/12/24 1335 11/13/24 0127  WBC 15.9*  --  11.2*  NEUTROABS 12.4*  --   --   HGB 12.3 12.6 11.5*  HCT 37.8 37.0 34.9*  MCV 96.9  --  95.4  PLT 225  --  182   No results for input(s): CKTOTAL, CKMB, CKMBINDEX, TROPONINI in the last 168 hours. No results for input(s): LABPROT, INR in the last 72 hours. No results for input(s): COLORURINE, LABSPEC, PHURINE, GLUCOSEU, HGBUR, BILIRUBINUR, KETONESUR, PROTEINUR, UROBILINOGEN, NITRITE, LEUKOCYTESUR in the last 72 hours.  Invalid input(s): APPERANCEUR  No results found for: CHOL, TRIG, HDL, CHOLHDL, VLDL, LDLCALC No results found for: HGBA1C No results found for: LABOPIA, COCAINSCRNUR, LABBENZ, AMPHETMU, THCU, LABBARB  No results for input(s): ETH in the last 168  hours.  I have personally reviewed the radiological images below and agree with the radiology interpretations.  MR BRAIN WO CONTRAST Result Date: 11/12/2024 EXAM: MRI BRAIN WITHOUT CONTRAST 11/12/2024 05:29:08 PM TECHNIQUE: Multiplanar multisequence MRI of the head/brain was performed without the administration of intravenous contrast. COMPARISON: None available. CLINICAL HISTORY: Neuro deficit, acute, stroke suspected. Progressive dizziness today. FINDINGS: BRAIN AND VENTRICLES: No acute infarct. No intracranial hemorrhage. No mass. No midline shift. No hydrocephalus. Advanced confluent periventricular and subcortical T2 hyperintensities are present bilaterally. White matter changes extend into the brainstem bilaterally. Dilated periventricular white matter changes are present within the basal ganglia. Chronic T2 signal changes are present in the thalami bilaterally. Remote infarcts are present in the inferior cerebellum bilaterally, left more prominent than right. The sella is unremarkable. Normal flow voids. ORBITS: No acute abnormality. SINUSES AND MASTOIDS: No acute abnormality. BONES AND SOFT TISSUES: Normal marrow signal. No acute soft tissue abnormality. IMPRESSION: 1. No acute intracranial abnormality. 2. Advanced confluent periventricular and subcortical T2 hyperintensities bilaterally extending into the brainstem and involving the basal ganglia. This most likely reflects the sequelae of chronic microvascular ischemia. 3. Remote inferior cerebellar infarcts bilaterally, left more prominent than right. Electronically signed by: Lonni Necessary MD 11/12/2024 05:49 PM EST RP Workstation:  [id number]   CT ANGIO HEAD NECK W WO CM W PERF (CODE STROKE) Result Date: 11/12/2024 EXAM: CTA Head and Neck with Perfusion 11/12/2024 03:12:31 PM TECHNIQUE: CTA of the head and neck was performed with the administration of intravenous contrast. 3D postprocessing with multiplanar reconstructions and MIPs was  performed to evaluate the vascular anatomy. Cerebral perfusion analysis using computed tomography with contrast administration, including post-processing of parametric maps with determination of cerebral blood flow, cerebral blood volume, mean transit time and time-to-maximum. Automated exposure control, iterative reconstruction, and/or weight based adjustment of the mA/kV was utilized to reduce the radiation dose to as low as reasonably achievable. COMPARISON: None available CLINICAL HISTORY: Neuro deficit, acute, stroke suspected. Progressive dizziness today. FINDINGS: CTA NECK: AORTIC ARCH AND ARCH VESSELS: No dissection or arterial injury. No significant stenosis of the brachiocephalic or subclavian arteries. CERVICAL CAROTID ARTERIES: Atherosclerotic calcifications are present within the carotid bifurcations and proximal left ICA bilaterally without significant stenosis. No dissection or arterial injury. CERVICAL VERTEBRAL ARTERIES: No dissection, arterial injury, or significant stenosis. LUNGS AND MEDIASTINUM: Unremarkable. SOFT TISSUES: No acute abnormality. BONES: No acute abnormality. CTA HEAD: ANTERIOR CIRCULATION: A 4 mm left ICA terminus aneurysm extends anteriorly and superiorly. No other aneurysm is identified in the anterior circulation. No significant stenosis of the internal carotid arteries. No significant stenosis of the anterior cerebral arteries. No significant stenosis of the middle cerebral arteries. POSTERIOR CIRCULATION: No significant stenosis of the posterior cerebral arteries. No significant stenosis of the basilar artery. No significant stenosis of the vertebral arteries. No aneurysm. OTHER: No dural venous sinus thrombosis on this non-dedicated study. CT PERFUSION: EXAM QUALITY: The perfusion study is nondiagnostic for potential ischemia due to significant patient motion, which resulted in artifactual bilateral CBF of 0 and Tmax of 155. Appropriate arterial inflow and venous outflow  curves were not reliably assessed. CORE INFARCT (CBF<30% volume): 0 ml TOTAL HYPOPERFUSION (Tmax>6s volume): Not reliably determined due to nondiagnostic study. The perfusion maps demonstrate bilateral white matter delayed tmax and significant patient motion is present. PENUMBRA: Not reliably determined due to nondiagnostic study. IMPRESSION: 1. No acute large vessel occlusion. 2. Perfusion study is nondiagnostic for potential ischemia due to significant patient motion artifact. No core infarct is suggested. 3. Atherosclerotic calcifications within the carotid bifurcations and proximal left ICA bilaterally without significant stenosis. 4. 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 Electronically signed by: Lonni Necessary MD 11/12/2024 03:44 PM EST RP Workstation: [id number]   CT HEAD CODE STROKE WO CONTRAST` Result Date: 11/12/2024 EXAM: CT HEAD WITHOUT 11/12/2024 02:50:35 PM TECHNIQUE: CT of the head was performed without the administration of intravenous contrast. Automated exposure control, iterative reconstruction, and/or weight based adjustment of the mA/kV was utilized to reduce the radiation dose to as low as reasonably achievable. COMPARISON: None available. CLINICAL HISTORY: Neuro deficit, acute, stroke suspected. Progressive dizziness today. FINDINGS: BRAIN AND VENTRICLES: Age indeterminate inferior right cerebellar infarct is noted. No acute intracranial hemorrhage. No mass effect or midline shift. No extra-axial fluid collection. Mild atrophy and advanced confluent white matter changes are present bilaterally. Remote lacunar infarcts are present in the thalami bilaterally. Posterior inferior left cerebellar infarcts are noted. No hydrocephalus. ORBITS: No acute abnormality. SINUSES AND MASTOIDS: No acute abnormality. SOFT TISSUES AND SKULL: No acute skull fracture. No acute soft tissue abnormality. [place] Stroke Program Early CT Score (ASPECTS) Ganglionic  (caudate, IC, lentiform nucleus, insula, M1-M3): 7 Supraganglionic (M4-M6): 3 Total: 10 IMPRESSION: 1. Age-indeterminate inferior right cerebellar infarct 2.  R. emote posterior inferior left cerebellar infarcts. 3. Aspects: 10. 4. Advanced confluent white matter disease bilaterally. 5. Remote bilateral thalamic lacunar infarcts. 6. these results were communicated to Dr. Jerri at 3:01 PM on 11/12/2024 by secure text page via the [facility] messaging system. Electronically signed by: Lonni Necessary MD 11/12/2024 03:03 PM EST RP Workstation: [id number]   DG Chest Port 1 View Result Date: 11/12/2024 CLINICAL DATA:  Dizziness. EXAM: PORTABLE CHEST 1 VIEW COMPARISON:  Chest radiograph dated 02/23/2015. FINDINGS: No focal consolidation, pleural effusion, or pneumothorax. Stable cardiac silhouette. No acute osseous pathology. IMPRESSION: No active disease. Electronically Signed   By: Vanetta Chou M.D.   On: 11/12/2024 13:46     PHYSICAL EXAM  Temp:  [97.5 F (36.4 C)-99.7 F (37.6 C)] 99.7 F (37.6 C) (11/24 1149) Pulse Rate:  [32-96] 91 (11/24 0715) Resp:  [14-29] 16 (11/24 0715) BP: (77-173)/(44-139) 150/74 (11/24 0715) SpO2:  [90 %-100 %] 97 % (11/24 0715) Weight:  [56.6 kg-68 kg] 56.6 kg (11/24 0500)  General - Well nourished, well developed, in no apparent distress.  Ophthalmologic - fundi not visualized due to noncooperation.  Cardiovascular - Regular rhythm and rate.  Mental Status -  Level of arousal and orientation to time, place, and person were intact. Language including expression, naming, repetition, comprehension was assessed and found intact. Fund of Knowledge was assessed and was intact.  Cranial Nerves II - XII - II - Visual field intact OU. III, IV, VI - Extraocular movements intact. V - Facial sensation intact bilaterally. VII - Facial movement intact bilaterally. VIII - Hearing & vestibular intact bilaterally. X - Palate elevates symmetrically. XI - Chin turning &  shoulder shrug intact bilaterally. XII - Tongue protrusion intact.  Motor Strength - The patient's strength was normal in all extremities and pronator drift was absent.  Bulk was normal and fasciculations were absent.   Motor Tone - Muscle tone was assessed at the neck and appendages and was normal.  Reflexes - The patient's reflexes were symmetrical in all extremities and she had no pathological reflexes.  Sensory - Light touch, temperature/pinprick were assessed and were symmetrical.    Coordination - The patient had normal movements in the hands with no ataxia or dysmetria.  Tremor was absent.  Gait and Station - deferred.   ASSESSMENT/PLAN Ms. Arlis G Moga is a 78 y.o. female with history of memory loss, hypothyroidism, HTN presenting with dizziness on and off for 4 weeks and then was found to have left upper extremity weakness in ER during cardiology evaluation of hypotension and bradycardia with complete heart block. No TNK given due to symptoms resolved after BP improvement.    Likely recrudescence of late subacute/chronic stroke symptoms in the setting of hypotension and bradycardia Chronic / late subacute stroke Pt stated that started to have vertigo, worse with motion about 4 weeks ago. Comes and goes with nausea. Currently still has motion induced vertigo and nausea.  CT Age-indeterminate inferior right cerebellar infarct, remote posterior inferior left cerebellar infarcts. Remote bilateral thalamic lacunar infarcts.  CTA head and neck Atherosclerotic calcifications within the carotid bifurcations and proximal left ICA bilaterally without significant stenosis. MRI  Remote inferior cerebellar infarcts bilaterally, left more prominent than right. 2D Echo  EF 55-60% Recommend prolonged cardiac monitoring such as 30 day cardiac event monitoring on discharge LDL pending HgbA1c pending SCDs for VTE prophylaxis No antithrombotic prior to admission, now on aspirin  81 mg daily and  clopidogrel  75 mg daily DAPT for 3 weeks  and then ASA alone Patient counseled to be compliant with her antithrombotic medications Ongoing aggressive stroke risk factor management Therapy recommendations:  pending Disposition:  pending  Hx of hypertension Hypotension on presentation Home meds: verapamil  BP 70s-80s on presentation S/p IVF and epinephrin Now off pressor and BP stable on the high end Avoid all nodal blocking agents Long term BP goal normotensive  Complete heart block  Bradycardia on presentation Now improved after stopping verapamil EKG showed no more heart block Avoid all nodal blocking agents Cardiology on board No pacer needed at this time  Hyperlipidemia Home meds:  lipitor 10  LDL pending, goal < 70 Now on lipitor 80 Continue statin at discharge  Cerebral aneurysm CTA showed 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 No intervention now Will follow up as outpt with Dr. Lester IR  Other Stroke Risk Factors Advanced age  Other Active Problems AKI, Cre 1.51--1.07 Hypothyroidism  Hospital day # 1  This patient is critically ill due to stroke like symptoms, AVB, hypotension and at significant risk of neurological worsening, death form heart failure, AVB, cardiac arrest, stroke and shock. This patient's care requires constant monitoring of vital signs, hemodynamics, respiratory and cardiac monitoring, review of multiple databases, neurological assessment, discussion with family, other specialists and medical decision making of high complexity. I spent 35 minutes of neurocritical care time in the care of this patient.  Ary Cummins, MD PhD Stroke Neurology 11/13/2024 12:48 PM    To contact Stroke Continuity provider, please refer to [email or web address]. After hours, contact General Neurology

## 2024-11-13 NOTE — Progress Notes (Signed)
 Progress Note  Patient Name: Rose Garza Date of Encounter: 11/13/2024  Primary Cardiologist: None   Patient ID  [id number] with HTN, CKD, HLD and hypothyroidism who presents with 3-week history of dizziness, lightheadedness, generalized malaise and fatigue. Cardiology is consulted for CHB with ventricular escape in the 30s.   Subjective   Weaned off epi last night with improvement in her heart rate and blood pressure.  Normal sinus rhythm 70-80s overnight with stable blood pressure off all pressors.  As verapamil washes out she does not seem to have any recurrent AV block.  Left sided deficits are markedly improved from exam yesterday.  She is 5 out of 5 strength in the left upper extremity and her numbness/tingling is resolved.  Still minor sensory deficits at fingertips but otherwise back to baseline.  Inpatient Medications    Scheduled Meds:  aspirin  EC  81 mg Oral Daily   aspirin   325 mg Oral Once   Or   aspirin   300 mg Rectal Once   atorvastatin   80 mg Oral Daily   Chlorhexidine  Gluconate Cloth  6 each Topical Daily   feeding supplement  237 mL Oral BID BM   levothyroxine   100 mcg Oral Q0600   Continuous Infusions:  sodium chloride  125 mL/hr at 11/13/24 0600   epinephrine  Stopped (11/12/24 1755)   PRN Meds: acetaminophen , ondansetron  (ZOFRAN ) IV, mouth rinse   Vital Signs    Vitals:   11/13/24 0300 11/13/24 0400 11/13/24 0500 11/13/24 0600  BP: (!) 156/69 (!) 173/76 (!) 164/86   Pulse: 95 96 84 94  Resp: (!) 22 16 20  (!) 23  Temp:  98.4 F (36.9 C)    TempSrc:  Oral    SpO2: 95% 94% 94% 96%  Weight:   56.6 kg   Height:        Intake/Output Summary (Last 24 hours) at 11/13/2024 0603 Last data filed at 11/13/2024 0600 Gross per 24 hour  Intake 2811.02 ml  Output 2275 ml  Net 536.02 ml   Filed Weights   11/12/24 1323 11/13/24 0500  Weight: 68 kg 56.6 kg    Telemetry    NSR 70-80s - Personally Reviewed  ECG    None today   Physical Exam   GEN:  No acute distress.   Neck: No JVD Cardiac: RRR, no murmurs, rubs, or gallops.  Respiratory: Clear to auscultation bilaterally. GI: Soft, nontender, non-distended  MS: No edema; No deformity. Neuro: LUE deficits mostly resolved, now 5/5 strength LUE and hand grip, minimal sensory deficits  Psych: Normal affect   Labs    Chemistry Recent Labs  Lab 11/12/24 1326 11/12/24 1335 11/13/24 0127  NA 135 137 138  K 4.1 4.2 4.0  CL 106 109 110  CO2 16*  --  23  GLUCOSE 139* 171* 81  BUN 26* 26* 21  CREATININE 1.51* 1.40* 1.07*  CALCIUM  8.9  --  8.2*  PROT 5.7*  --   --   ALBUMIN 3.0*  --   --   AST 25  --   --   ALT 9  --   --   ALKPHOS 48  --   --   BILITOT 0.6  --   --   GFRNONAA 35*  --  53*  ANIONGAP 13  --  5     Hematology Recent Labs  Lab 11/12/24 1326 11/12/24 1335 11/13/24 0127  WBC 15.9*  --  11.2*  RBC 3.90  --  3.66*  HGB 12.3  12.6 11.5*  HCT 37.8 37.0 34.9*  MCV 96.9  --  95.4  MCH 31.5  --  31.4  MCHC 32.5  --  33.0  RDW 13.1  --  13.1  PLT 225  --  182   Radiology    MR BRAIN WO CONTRAST Result Date: 11/12/2024 EXAM: MRI BRAIN WITHOUT CONTRAST 11/12/2024 05:29:08 PM TECHNIQUE: Multiplanar multisequence MRI of the head/brain was performed without the administration of intravenous contrast. COMPARISON: None available. CLINICAL HISTORY: Neuro deficit, acute, stroke suspected. Progressive dizziness today. FINDINGS: BRAIN AND VENTRICLES: No acute infarct. No intracranial hemorrhage. No mass. No midline shift. No hydrocephalus. Advanced confluent periventricular and subcortical T2 hyperintensities are present bilaterally. White matter changes extend into the brainstem bilaterally. Dilated periventricular white matter changes are present within the basal ganglia. Chronic T2 signal changes are present in the thalami bilaterally. Remote infarcts are present in the inferior cerebellum bilaterally, left more prominent than right. The sella is unremarkable. Normal flow  voids. ORBITS: No acute abnormality. SINUSES AND MASTOIDS: No acute abnormality. BONES AND SOFT TISSUES: Normal marrow signal. No acute soft tissue abnormality. IMPRESSION: 1. No acute intracranial abnormality. 2. Advanced confluent periventricular and subcortical T2 hyperintensities bilaterally extending into the brainstem and involving the basal ganglia. This most likely reflects the sequelae of chronic microvascular ischemia. 3. Remote inferior cerebellar infarcts bilaterally, left more prominent than right. Electronically signed by: Lonni Necessary MD 11/12/2024 05:49 PM EST RP Workstation: [id number]   CT ANGIO HEAD NECK W WO CM W PERF (CODE STROKE) Result Date: 11/12/2024 EXAM: CTA Head and Neck with Perfusion 11/12/2024 03:12:31 PM TECHNIQUE: CTA of the head and neck was performed with the administration of intravenous contrast. 3D postprocessing with multiplanar reconstructions and MIPs was performed to evaluate the vascular anatomy. Cerebral perfusion analysis using computed tomography with contrast administration, including post-processing of parametric maps with determination of cerebral blood flow, cerebral blood volume, mean transit time and time-to-maximum. Automated exposure control, iterative reconstruction, and/or weight based adjustment of the mA/kV was utilized to reduce the radiation dose to as low as reasonably achievable. COMPARISON: None available CLINICAL HISTORY: Neuro deficit, acute, stroke suspected. Progressive dizziness today. FINDINGS: CTA NECK: AORTIC ARCH AND ARCH VESSELS: No dissection or arterial injury. No significant stenosis of the brachiocephalic or subclavian arteries. CERVICAL CAROTID ARTERIES: Atherosclerotic calcifications are present within the carotid bifurcations and proximal left ICA bilaterally without significant stenosis. No dissection or arterial injury. CERVICAL VERTEBRAL ARTERIES: No dissection, arterial injury, or significant stenosis. LUNGS AND  MEDIASTINUM: Unremarkable. SOFT TISSUES: No acute abnormality. BONES: No acute abnormality. CTA HEAD: ANTERIOR CIRCULATION: A 4 mm left ICA terminus aneurysm extends anteriorly and superiorly. No other aneurysm is identified in the anterior circulation. No significant stenosis of the internal carotid arteries. No significant stenosis of the anterior cerebral arteries. No significant stenosis of the middle cerebral arteries. POSTERIOR CIRCULATION: No significant stenosis of the posterior cerebral arteries. No significant stenosis of the basilar artery. No significant stenosis of the vertebral arteries. No aneurysm. OTHER: No dural venous sinus thrombosis on this non-dedicated study. CT PERFUSION: EXAM QUALITY: The perfusion study is nondiagnostic for potential ischemia due to significant patient motion, which resulted in artifactual bilateral CBF of 0 and Tmax of 155. Appropriate arterial inflow and venous outflow curves were not reliably assessed. CORE INFARCT (CBF<30% volume): 0 ml TOTAL HYPOPERFUSION (Tmax>6s volume): Not reliably determined due to nondiagnostic study. The perfusion maps demonstrate bilateral white matter delayed tmax and significant patient motion is present. PENUMBRA: Not reliably determined  due to nondiagnostic study. IMPRESSION: 1. No acute large vessel occlusion. 2. Perfusion study is nondiagnostic for potential ischemia due to significant patient motion artifact. No core infarct is suggested. 3. Atherosclerotic calcifications within the carotid bifurcations and proximal left ICA bilaterally without significant stenosis. 4. 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 Electronically signed by: Lonni Necessary MD 11/12/2024 03:44 PM EST RP Workstation: [id number]   CT HEAD CODE STROKE WO CONTRAST` Result Date: 11/12/2024 EXAM: CT HEAD WITHOUT 11/12/2024 02:50:35 PM TECHNIQUE: CT of the head was performed without the administration of intravenous  contrast. Automated exposure control, iterative reconstruction, and/or weight based adjustment of the mA/kV was utilized to reduce the radiation dose to as low as reasonably achievable. COMPARISON: None available. CLINICAL HISTORY: Neuro deficit, acute, stroke suspected. Progressive dizziness today. FINDINGS: BRAIN AND VENTRICLES: Age indeterminate inferior right cerebellar infarct is noted. No acute intracranial hemorrhage. No mass effect or midline shift. No extra-axial fluid collection. Mild atrophy and advanced confluent white matter changes are present bilaterally. Remote lacunar infarcts are present in the thalami bilaterally. Posterior inferior left cerebellar infarcts are noted. No hydrocephalus. ORBITS: No acute abnormality. SINUSES AND MASTOIDS: No acute abnormality. SOFT TISSUES AND SKULL: No acute skull fracture. No acute soft tissue abnormality. [place] Stroke Program Early CT Score (ASPECTS) Ganglionic (caudate, IC, lentiform nucleus, insula, M1-M3): 7 Supraganglionic (M4-M6): 3 Total: 10 IMPRESSION: 1. Age-indeterminate inferior right cerebellar infarct 2. R. emote posterior inferior left cerebellar infarcts. 3. Aspects: 10. 4. Advanced confluent white matter disease bilaterally. 5. Remote bilateral thalamic lacunar infarcts. 6. these results were communicated to Dr. Jerri at 3:01 PM on 11/12/2024 by secure text page via the [facility] messaging system. Electronically signed by: Lonni Necessary MD 11/12/2024 03:03 PM EST RP Workstation: [id number]   DG Chest Port 1 View Result Date: 11/12/2024 CLINICAL DATA:  Dizziness. EXAM: PORTABLE CHEST 1 VIEW COMPARISON:  Chest radiograph dated 02/23/2015. FINDINGS: No focal consolidation, pleural effusion, or pneumothorax. Stable cardiac silhouette. No acute osseous pathology. IMPRESSION: No active disease. Electronically Signed   By: Vanetta Chou M.D.   On: 11/12/2024 13:46    Cardiac Studies   None today   Assessment & Plan   73F with HTN, CKD, HLD  and hypothyroidism who presents with 3-week history of dizziness, lightheadedness, generalized malaise and fatigue. Cardiology is consulted for CHB with ventricular escape in the 30s.   CHB Acute stroke  No recurrent high degree AV block after verapamil is washing out.  Avoiding all AV nodal blocking agents in the future.  She was currently on verapamil to 40 mg SR twice daily for hypertension and has been bradycardic on all ECGs outside of our system up to this point.  I think that the majority of her symptoms are related to her stroke.  I think that her stroke was more acute than imaging suggested as she developed worsening deficits during my evaluation and exam yesterday.  She potentially had acute on subacute deficits or with significant bradycardia and relative hypotension was hypoperfusing and maybe this represents recrudescence from recent stroke.  Either way for now she is improving and heart rate is stable.  No indication for PPM at this time.  For questions or updates, please contact [facility] Please consult [email or web address] for contact info under Cardiology/STEMI.    Signed,  [name], MD [facility]  Cardiac Electrophysiology  11/13/2024, 6:03 AM

## 2024-11-13 NOTE — TOC Initial Note (Signed)
 Transition of Care [facility]) - Initial/Assessment Note    Patient Details  Name: Rose Garza MRN: [id number] Date of Birth: [birth year]  [facility]) CM/SW Contact:    Rosalva Jon Bloch, RN Phone Number: 11/13/2024, 4:22 PM  Clinical Narrative:                 Presents with c/o dizziness, lightheadedness, generalized malaise and fatigue. CHB. NCM spoke with pt @ bedside in regard to d/c planning. Pt states from home alone. Supportive sons ( permission given to speak with sons if needed),and friends. PTA independent with ADL'S , no DME usage. Affiliated with [facility] in [place], outpatient PT services . States no DME usage. Pt without transportation issues or RX med concerns. PCP confirmed : [name]  ICM team following and will assist with needs...  Expected Discharge Plan: Home/Self Care Barriers to Discharge: Continued Medical Work up   Patient Goals and CMS Choice Patient states their goals for this hospitalization and ongoing recovery are:: to get better and go home          Expected Discharge Plan and Services   Discharge Planning Services: CM Consult   Living arrangements for the past 2 months: Single Family Home                                      Prior Living Arrangements/Services Living arrangements for the past 2 months: Single Family Home Lives with:: Self Patient language and need for interpreter reviewed:: Yes Do you feel safe going back to the place where you live?: Yes      Need for Family Participation in Patient Care: Yes (Comment) Care giver support system in place?: Yes (comment)   Criminal Activity/Legal Involvement Pertinent to Current Situation/Hospitalization: No - Comment as needed  Activities of Daily Living   ADL Screening (condition at time of admission) Independently performs ADLs?: Yes (appropriate for developmental age) Is the patient deaf or have difficulty hearing?: Yes Does the patient have  difficulty seeing, even when wearing glasses/contacts?: No Does the patient have difficulty concentrating, remembering, or making decisions?: No  Permission Sought/Granted   Permission granted to share information with : Yes, Verbal Permission Granted  Share Information with NAME: Rose Garza  (Son)  [phone],  Rose Garza (Son)  [phone]           Emotional Assessment Appearance:: Appears stated age Attitude/Demeanor/Rapport: Engaged Affect (typically observed): Accepting Orientation: : Oriented to Self, Oriented to Place, Oriented to  Time, Oriented to Situation Alcohol / Substance Use: Not Applicable Psych Involvement: No (comment)  Admission diagnosis:  Complete heart block (HCC) [I44.2] Weakness [R53.1] Patient Active Problem List   Diagnosis Date Noted   Complete heart block (HCC) 11/12/2024   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, [place] - [address] [facility] [address] [place] [address] Phone: [phone] Fax: [phone]     Social Drivers of Health (SDOH) Social History: SDOH Screenings   Food Insecurity: No Food Insecurity (11/12/2024)  Housing: Low Risk  (11/12/2024)  Transportation Needs: No Transportation Needs (11/12/2024)  Utilities: Not At Risk (11/12/2024)  Recent Concern: Utilities - At Risk (10/18/2024)   Received from [facility]  Financial Resource Strain: Low Risk  (10/18/2024)   Received from [facility]  Physical Activity: Inactive (10/18/2024)   Received [facility]  Social Connections: Moderately Integrated (11/12/2024)  Stress: No Stress Concern Present (10/18/2024)   Received from [facility]  Tobacco Use: Low Risk  (11/12/2024)   SDOH Interventions:     Readmission  Risk Interventions     No data to display

## 2024-11-13 NOTE — Progress Notes (Signed)
 This RN paged neurology MD regarding the patient's elevated blood pressures. The previous neurology note stated that a systolic under 130 is preferable and the patient has had multiple readings in the 140s-170s systolic. Was told to not worry about the current pressures unless it starts getting into the 200s systolic. Will continue to monitor.

## 2024-11-14 ENCOUNTER — Telehealth: Payer: Self-pay | Admitting: Neuroradiology

## 2024-11-14 ENCOUNTER — Other Ambulatory Visit: Payer: Self-pay

## 2024-11-14 DIAGNOSIS — R42 Dizziness and giddiness: Secondary | ICD-10-CM | POA: Diagnosis not present

## 2024-11-14 DIAGNOSIS — R001 Bradycardia, unspecified: Secondary | ICD-10-CM | POA: Diagnosis not present

## 2024-11-14 DIAGNOSIS — I442 Atrioventricular block, complete: Secondary | ICD-10-CM | POA: Diagnosis not present

## 2024-11-14 DIAGNOSIS — I69334 Monoplegia of upper limb following cerebral infarction affecting left non-dominant side: Secondary | ICD-10-CM | POA: Diagnosis not present

## 2024-11-14 DIAGNOSIS — I69398 Other sequelae of cerebral infarction: Secondary | ICD-10-CM | POA: Diagnosis not present

## 2024-11-14 DIAGNOSIS — I739 Peripheral vascular disease, unspecified: Secondary | ICD-10-CM

## 2024-11-14 DIAGNOSIS — I639 Cerebral infarction, unspecified: Secondary | ICD-10-CM | POA: Diagnosis not present

## 2024-11-14 LAB — LIPID PANEL
Cholesterol: 138 mg/dL (ref 0–200)
HDL: 60 mg/dL (ref >40)
LDL Cholesterol: 66 mg/dL (ref 0–99)
Total CHOL/HDL Ratio: 2.3 ratio
Triglycerides: 62 mg/dL (ref <150)
VLDL: 12 mg/dL (ref 0–40)

## 2024-11-14 LAB — BASIC METABOLIC PANEL WITH GFR
Anion gap: 11 (ref 5–15)
BUN: 12 mg/dL (ref 8–23)
CO2: 23 mmol/L (ref 22–32)
Calcium: 8 mg/dL — ABNORMAL LOW (ref 8.9–10.3)
Chloride: 108 mmol/L (ref 98–111)
Creatinine, Ser: 0.86 mg/dL (ref 0.44–1.00)
GFR, Estimated: >60 mL/min (ref >60)
Glucose, Bld: 71 mg/dL (ref 70–99)
Potassium: 3 mmol/L — ABNORMAL LOW (ref 3.5–5.1)
Sodium: 142 mmol/L (ref 135–145)

## 2024-11-14 MED ORDER — HYDROCHLOROTHIAZIDE 25 MG PO TABS
25.0000 mg | ORAL_TABLET | Freq: Every day | ORAL | Status: DC
  Administered 2024-11-14 – 2024-11-15 (×2): 25 mg via ORAL
  Filled 2024-11-14 (×3): qty 1

## 2024-11-14 MED ORDER — ATORVASTATIN CALCIUM 40 MG PO TABS
40.0000 mg | ORAL_TABLET | Freq: Every day | ORAL | Status: DC
  Administered 2024-11-14 – 2024-11-15 (×2): 40 mg via ORAL
  Filled 2024-11-14 (×2): qty 1

## 2024-11-14 MED ORDER — LOSARTAN POTASSIUM 25 MG PO TABS
100.0000 mg | ORAL_TABLET | Freq: Every day | ORAL | Status: DC
  Administered 2024-11-14 – 2024-11-15 (×2): 100 mg via ORAL
  Filled 2024-11-14 (×2): qty 4

## 2024-11-14 MED ORDER — POTASSIUM CHLORIDE CRYS ER 20 MEQ PO TBCR
40.0000 meq | EXTENDED_RELEASE_TABLET | ORAL | Status: AC
  Administered 2024-11-14 (×2): 40 meq via ORAL
  Filled 2024-11-14: qty 4
  Filled 2024-11-14: qty 2

## 2024-11-14 NOTE — Evaluation (Signed)
 Physical Therapy Evaluation Patient Details Name: Rose Garza MRN: [id number] DOB: 1946-01-15 Today's Date: 11/14/2024  History of Present Illness  78 y.o. female presenting 11/23 with dizziness on and off for 4 weeks found to have left upper extremity weakness in ER during cardiology evaluation of hypotension and bradycardia with complete heart block. Likely recrudescence of late subacute/chronic stroke symptoms in the setting of hypotension and bradycardia  Chronic / late subacute stroke. PMH: memory loss, hypothyroidism, HTN   Clinical Impression  Pt admitted with above diagnosis. Previously independent without an assistive device, driving to OPPT for neck pain and balance training. Currently, no appreciable focal strength or sensory deficits noted but does show gross instability with mild ataxia when standing and ambulating. Notable posterior bias. She is reliant on RW for support and requires intermittent min assist for balance. Mild motion sensitivity with abnormal saccades when tracking vertically. Favor CIR look for qualification. Will make her a priority to reassess tomorrow and see if she is making rapid improvements; encourage her to ambulate frequently with staff assist. Pt currently with functional limitations due to the deficits listed below (see PT Problem List). Pt will benefit from acute skilled PT to increase their independence and safety with mobility to allow discharge.       SpO2 91% with exertion no dyspnea. Pre activity HR 79, SpO2 94% on RA, BP 181/79 supine. Post activity BP 141/87, HR 110s, SpO2 91-92%.    If plan is discharge home, recommend the following: A little help with walking and/or transfers;A little help with bathing/dressing/bathroom;Assistance with cooking/housework;Assist for transportation;Help with stairs or ramp for entrance   Can travel by private vehicle        Equipment Recommendations Rolling walker (2 wheels) (If she does not have already (was  unsure))  Recommendations for Other Services  Rehab consult    Functional Status Assessment Patient has had a recent decline in their functional status and demonstrates the ability to make significant improvements in function in a reasonable and predictable amount of time.     Precautions / Restrictions Precautions Precautions: Fall Recall of Precautions/Restrictions: Intact Restrictions Weight Bearing Restrictions Per Provider Order: No      Mobility  Bed Mobility Overal bed mobility: Modified Independent             General bed mobility comments: extra time, no assist.    Transfers Overall transfer level: Needs assistance Equipment used: None, Rolling walker (2 wheels) Transfers: Sit to/from Stand Sit to Stand: Min assist           General transfer comment: Min assist for balance (posterior instability) with and without RW. Feels more confident with RW. Good control with descent.    Ambulation/Gait Ambulation/Gait assistance: Min assist Gait Distance (Feet): 125 Feet Assistive device: Rolling walker (2 wheels), None Gait Pattern/deviations: Step-through pattern, Decreased stride length, Ataxic, Staggering left, Staggering right, Leaning posteriorly Gait velocity: dec Gait velocity interpretation: <1.31 ft/sec, indicative of household ambulator   General Gait Details: Intermittent LOB requiring min assist to stabilize. This gradually improved with distance but she remained heavily reliant on RW for support. With RW removed, pt with exacerbated LOB. Educated on awareness, safe AD use, and techniques to improve stability. Able to turn head horizontally and nod several times with only minor exacerbation while holding RW but very slow and guarded.  Stairs            Wheelchair Mobility     Tilt Bed    Modified Rankin (Stroke Patients  Only) Modified Rankin (Stroke Patients Only) Pre-Morbid Rankin Score: No significant disability Modified Rankin:  Moderately severe disability     Balance Overall balance assessment: Needs assistance Sitting-balance support: No upper extremity supported, Feet supported Sitting balance-Leahy Scale: Good   Postural control: Posterior lean Standing balance support: No upper extremity supported Standing balance-Leahy Scale: Poor Standing balance comment: Intermittent min assist for posterior LOB when standing statically without UE support.                             Pertinent Vitals/Pain Pain Assessment Pain Assessment: No/denies pain    Home Living Family/patient expects to be discharged to:: Private residence Living Arrangements: Alone Available Help at Discharge: Family;Available PRN/intermittently (both sons in area but they work) Type of Home: House Home Access: Stairs to enter Entrance Stairs-Rails: None (front steps x2 with bil rails,) Entrance Stairs-Number of Steps: 1 (3 in front with bil rails)   Home Layout: Two level;Able to live on main level with bedroom/bathroom Home Equipment: [place] - single point;Shower seat - built in;Wheelchair - manual (unsure if she has a RW or rollator but has one of these.)      Prior Function Prior Level of Function : Independent/Modified Independent;Driving             Mobility Comments: Denies falls or using AD but reports frequent LOB. Was going to OPPT for balance. ADLs Comments: ind     Extremity/Trunk Assessment   Upper Extremity Assessment Upper Extremity Assessment: Defer to OT evaluation    Lower Extremity Assessment Lower Extremity Assessment: Overall WFL for tasks assessed (No focal deficits noted, strength intact)       Communication   Communication Communication: Impaired Factors Affecting Communication: Hearing impaired    Cognition Arousal: Alert Behavior During Therapy: WFL for tasks assessed/performed   PT - Cognitive impairments: No apparent impairments                         Following  commands: Intact       Cueing Cueing Techniques: Verbal cues     General Comments General comments (skin integrity, edema, etc.): SpO2 91% with exertion no dyspnea. Pre activity HR 79, SpO2 94% on RA, BP 181/79 supine. Post activity BP 141/87, HR 110s, SpO2 91-92%.    Exercises     Assessment/Plan    PT Assessment Patient needs continued PT services  PT Problem List Decreased activity tolerance;Decreased balance;Decreased mobility;Decreased coordination;Decreased knowledge of use of DME       PT Treatment Interventions DME instruction;Gait training;Stair training;Functional mobility training;Therapeutic activities;Therapeutic exercise;Balance training;Neuromuscular re-education;Patient/family education    PT Goals (Current goals can be found in the Care Plan section)  Acute Rehab PT Goals Patient Stated Goal: Get well, return home PT Goal Formulation: With patient Time For Goal Achievement: 11/28/24 Potential to Achieve Goals: Good    Frequency Min 3X/week     Co-evaluation               AM-PAC PT 6 Clicks Mobility  Outcome Measure Help needed turning from your back to your side while in a flat bed without using bedrails?: None Help needed moving from lying on your back to sitting on the side of a flat bed without using bedrails?: None Help needed moving to and from a bed to a chair (including a wheelchair)?: A Little Help needed standing up from a chair using your arms (e.g., wheelchair or  bedside chair)?: A Little Help needed to walk in hospital room?: A Little Help needed climbing 3-5 steps with a railing? : A Little 6 Click Score: 20    End of Session Equipment Utilized During Treatment: Gait belt Activity Tolerance: Patient tolerated treatment well Patient left: in chair;with call bell/phone within reach;with chair alarm set;with SCD's reapplied Nurse Communication: Mobility status PT Visit Diagnosis: Unsteadiness on feet (R26.81);Other abnormalities of  gait and mobility (R26.89);Ataxic gait (R26.0);Difficulty in walking, not elsewhere classified (R26.2);Other symptoms and signs involving the nervous system (R29.898)    Time: 9263-9184 PT Time Calculation (min) (ACUTE ONLY): 39 min   Charges:   PT Evaluation $PT Eval Moderate Complexity: 1 Mod PT Treatments $Gait Training: 8-22 mins $Therapeutic Activity: 8-22 mins PT General Charges $$ ACUTE PT VISIT: 1 Visit         Leontine Roads, PT, DPT [facility]  Rehabilitation Services Physical Therapist Office: [phone] Website: [email or web address]   Leontine GORMAN Roads 11/14/2024, 11:20 AM

## 2024-11-14 NOTE — H&P (View-Only) (Signed)
  Patient Name: Rose Garza Date of Encounter: 11/14/2024  Primary Cardiologist: None Electrophysiologist: None  Interval Summary   NAEO. BP remains high. Neuro deficits have resolved.  Maintaning NSR  Vital Signs    Vitals:   11/14/24 0400 11/14/24 0500 11/14/24 0600 11/14/24 0700  BP: (!) 169/81 (!) 178/152 (!) 180/80   Pulse: 86 73 78 82  Resp: 19 13 15 13   Temp:      TempSrc:      SpO2: 91% 92% 94% 94%  Weight:  49.8 kg    Height:        Intake/Output Summary (Last 24 hours) at 11/14/2024 0713 Last data filed at 11/14/2024 9385 Gross per 24 hour  Intake --  Output 1900 ml  Net -1900 ml   Filed Weights   11/12/24 1323 11/13/24 0500 11/14/24 0500  Weight: 68 kg 56.6 kg 49.8 kg    Physical Exam    GEN- NAD, Alert and oriented  Lungs- Clear to ausculation bilaterally, normal work of breathing Cardiac- Regular rate and rhythm, no murmurs, rubs or gallops GI- soft, NT, ND, + BS Extremities- no clubbing or cyanosis. No edema  Telemetry    NSR 70-80s (personally reviewed)  Hospital Course    70F with HTN, CKD, HLD and hypothyroidism who presents with 3-week history of dizziness, lightheadedness, generalized malaise and fatigue. Cardiology is consulted for CHB with ventricular escape in the 30s.   Assessment & Plan    CHB Resolved with washout of verapamil  HTN Increase losartan  and follow.  Acute CVA On ASA + Plavix .  Monitor vs loop at discharge.  Neuro following.   PT to eval and make recommendations.  Will transfer to floor and potentially plan home tomorrow +/- loop pending course.    For questions or updates, please contact [facility] Please consult [email or web address] for contact info under     Signed, Ozell Prentice Passey, PA-C  11/14/2024, 7:13 AM

## 2024-11-14 NOTE — Plan of Care (Signed)
   Problem: Education: Goal: Knowledge of General Education information will improve Description Including pain rating scale, medication(s)/side effects and non-pharmacologic comfort measures Outcome: Progressing   Problem: Activity: Goal: Risk for activity intolerance will decrease Outcome: Progressing   Problem: Safety: Goal: Ability to remain free from injury will improve Outcome: Progressing

## 2024-11-14 NOTE — Telephone Encounter (Signed)
 Called pt for a 4 week follow up. Left a VM.

## 2024-11-14 NOTE — Progress Notes (Signed)
 STROKE TEAM PROGRESS NOTE   SUBJECTIVE (INTERVAL HISTORY) A female family member is at the bedside. Pt has been doing well, she stated that she worked with PT and OT, walked in the hallway and no more vertigo and no more nausea feeling. However, PT and OT have evaluated her and  recommended CIR. EP on board, plan for loop recorder tomorrow.    OBJECTIVE Temp:  [97.7 F (36.5 C)-99.9 F (37.7 C)] 97.7 F (36.5 C) (11/25 0700) Pulse Rate:  [73-103] 82 (11/25 0700) Cardiac Rhythm: Normal sinus rhythm (11/24 2000) Resp:  [12-26] 13 (11/25 0700) BP: (125-180)/(67-152) 180/80 (11/25 0600) SpO2:  [91 %-100 %] 94 % (11/25 0700) Weight:  [49.8 kg] 49.8 kg (11/25 0500)  Recent Labs  Lab 11/12/24 1336  GLUCAP 153*   Recent Labs  Lab 11/12/24 1326 11/12/24 1335 11/12/24 1818 11/13/24 0127 11/14/24 0342  NA 135 137  --  138 142  K 4.1 4.2  --  4.0 3.0*  CL 106 109  --  110 108  CO2 16*  --   --  23 23  GLUCOSE 139* 171*  --  81 71  BUN 26* 26*  --  21 12  CREATININE 1.51* 1.40*  --  1.07* 0.86  CALCIUM  8.9  --   --  8.2* 8.0*  MG  --   --  1.7  --   --    Recent Labs  Lab 11/12/24 1326  AST 25  ALT 9  ALKPHOS 48  BILITOT 0.6  PROT 5.7*  ALBUMIN 3.0*   Recent Labs  Lab 11/12/24 1326 11/12/24 1335 11/13/24 0127  WBC 15.9*  --  11.2*  NEUTROABS 12.4*  --   --   HGB 12.3 12.6 11.5*  HCT 37.8 37.0 34.9*  MCV 96.9  --  95.4  PLT 225  --  182   No results for input(s): CKTOTAL, CKMB, CKMBINDEX, TROPONINI in the last 168 hours. No results for input(s): LABPROT, INR in the last 72 hours. No results for input(s): COLORURINE, LABSPEC, PHURINE, GLUCOSEU, HGBUR, BILIRUBINUR, KETONESUR, PROTEINUR, UROBILINOGEN, NITRITE, LEUKOCYTESUR in the last 72 hours.  Invalid input(s): APPERANCEUR     Component Value Date/Time   CHOL 138 11/14/2024 0342   TRIG 62 11/14/2024 0342   HDL 60 11/14/2024 0342   CHOLHDL 2.3 11/14/2024 0342   VLDL 12  11/14/2024 0342   LDLCALC 66 11/14/2024 0342   Lab Results  Component Value Date   HGBA1C 5.2 11/13/2024   No results found for: LABOPIA, COCAINSCRNUR, LABBENZ, AMPHETMU, THCU, LABBARB  No results for input(s): ETH in the last 168 hours.  I have personally reviewed the radiological images below and agree with the radiology interpretations.  ECHOCARDIOGRAM COMPLETE Result Date: 11/13/2024    ECHOCARDIOGRAM REPORT   Patient Name:   Michaela G Gillihan Date of Exam: 11/13/2024 Medical Rec #:  [id number]       Height:       61.7 in Accession #:    [id number]      Weight:       124.8 lb Date of Birth:  07-08-1946       BSA:          1.559 m Patient Age:    78 years        BP:           146/81 mmHg Patient Gender: F               HR:  87 bpm. Exam Location:  Inpatient Procedure: 2D Echo (Both Spectral and Color Flow Doppler were utilized during            procedure). Indications:    Complete Heart Block  History:        Patient has prior history of Echocardiogram examinations, most                 recent 06/18/2015. Risk Factors:Dyslipidemia and Hypertension.  Sonographer:    Sherlean Dubin Sonographer#2:  Tinnie Barefoot RDCS Referring Phys: [id number] LOGAN N LOCKWOOD IMPRESSIONS  1. Left ventricular ejection fraction, by estimation, is 55 to 60%. The left ventricle has normal function. The left ventricle has no regional wall motion abnormalities. Left ventricular diastolic parameters are consistent with Grade I diastolic dysfunction (impaired relaxation).  2. Right ventricular systolic function is normal. The right ventricular size is normal.  3. Left atrial size was moderately dilated.  4. The mitral valve is normal in structure. Mild mitral valve regurgitation. No evidence of mitral stenosis.  5. The aortic valve is normal in structure. Aortic valve regurgitation is mild. Aortic valve sclerosis/calcification is present, without any evidence of aortic stenosis.  6. The inferior vena cava  is normal in size with greater than 50% respiratory variability, suggesting right atrial pressure of 3 mmHg. FINDINGS  Left Ventricle: Left ventricular ejection fraction, by estimation, is 55 to 60%. The left ventricle has normal function. The left ventricle has no regional wall motion abnormalities. The left ventricular internal cavity size was normal in size. There is  no left ventricular hypertrophy. Left ventricular diastolic parameters are consistent with Grade I diastolic dysfunction (impaired relaxation). Right Ventricle: The right ventricular size is normal. No increase in right ventricular wall thickness. Right ventricular systolic function is normal. Left Atrium: Left atrial size was moderately dilated. Right Atrium: Right atrial size was normal in size. Pericardium: There is no evidence of pericardial effusion. Mitral Valve: The mitral valve is normal in structure. Mild mitral valve regurgitation. No evidence of mitral valve stenosis. Tricuspid Valve: The tricuspid valve is normal in structure. Tricuspid valve regurgitation is not demonstrated. No evidence of tricuspid stenosis. Aortic Valve: The aortic valve is normal in structure. Aortic valve regurgitation is mild. Aortic regurgitation PHT measures 363 msec. Aortic valve sclerosis/calcification is present, without any evidence of aortic stenosis. Pulmonic Valve: The pulmonic valve was normal in structure. Pulmonic valve regurgitation is mild. No evidence of pulmonic stenosis. Aorta: The aortic root is normal in size and structure. Venous: The inferior vena cava is normal in size with greater than 50% respiratory variability, suggesting right atrial pressure of 3 mmHg. IAS/Shunts: No atrial level shunt detected by color flow Doppler.  LEFT VENTRICLE PLAX 2D LVIDd:         4.10 cm   Diastology LVIDs:         2.90 cm   LV e' medial:    8.38 cm/s LV PW:         1.10 cm   LV E/e' medial:  12.1 LV IVS:        0.90 cm   LV e' lateral:   8.05 cm/s LVOT diam:      2.00 cm   LV E/e' lateral: 12.5 LV SV:         55 LV SV Index:   35 LVOT Area:     3.14 cm LV IVRT:       99 msec  RIGHT VENTRICLE  IVC RV Basal diam:  2.30 cm     IVC diam: 1.10 cm RV S prime:     14.50 cm/s TAPSE (M-mode): 2.5 cm LEFT ATRIUM             Index        RIGHT ATRIUM           Index LA diam:        3.50 cm 2.24 cm/m   RA Area:     11.30 cm LA Vol (A2C):   44.9 ml 28.79 ml/m  RA Volume:   20.90 ml  13.40 ml/m LA Vol (A4C):   62.7 ml 40.21 ml/m LA Biplane Vol: 56.6 ml 36.30 ml/m  AORTIC VALVE LVOT Vmax:   101.00 cm/s LVOT Vmean:  67.700 cm/s LVOT VTI:    0.175 m AI PHT:      363 msec  AORTA Ao Root diam: 2.80 cm Ao Asc diam:  3.30 cm MR Peak grad: 131.3 mmHg MR Vmax:      573.00 cm/s   SHUNTS MV E velocity: 101.00 cm/s  Systemic VTI:  0.18 m MV A velocity: 147.00 cm/s  Systemic Diam: 2.00 cm MV E/A ratio:  0.69 Morene Brownie Electronically signed by Morene Brownie Signature Date/Time: 11/13/2024/1:38:45 PM    Final    MR BRAIN WO CONTRAST Result Date: 11/12/2024 EXAM: MRI BRAIN WITHOUT CONTRAST 11/12/2024 05:29:08 PM TECHNIQUE: Multiplanar multisequence MRI of the head/brain was performed without the administration of intravenous contrast. COMPARISON: None available. CLINICAL HISTORY: Neuro deficit, acute, stroke suspected. Progressive dizziness today. FINDINGS: BRAIN AND VENTRICLES: No acute infarct. No intracranial hemorrhage. No mass. No midline shift. No hydrocephalus. Advanced confluent periventricular and subcortical T2 hyperintensities are present bilaterally. White matter changes extend into the brainstem bilaterally. Dilated periventricular white matter changes are present within the basal ganglia. Chronic T2 signal changes are present in the thalami bilaterally. Remote infarcts are present in the inferior cerebellum bilaterally, left more prominent than right. The sella is unremarkable. Normal flow voids. ORBITS: No acute abnormality. SINUSES AND MASTOIDS: No acute  abnormality. BONES AND SOFT TISSUES: Normal marrow signal. No acute soft tissue abnormality. IMPRESSION: 1. No acute intracranial abnormality. 2. Advanced confluent periventricular and subcortical T2 hyperintensities bilaterally extending into the brainstem and involving the basal ganglia. This most likely reflects the sequelae of chronic microvascular ischemia. 3. Remote inferior cerebellar infarcts bilaterally, left more prominent than right. Electronically signed by: Lonni Necessary MD 11/12/2024 05:49 PM EST RP Workstation: [id number]   CT ANGIO HEAD NECK W WO CM W PERF (CODE STROKE) Result Date: 11/12/2024 EXAM: CTA Head and Neck with Perfusion 11/12/2024 03:12:31 PM TECHNIQUE: CTA of the head and neck was performed with the administration of intravenous contrast. 3D postprocessing with multiplanar reconstructions and MIPs was performed to evaluate the vascular anatomy. Cerebral perfusion analysis using computed tomography with contrast administration, including post-processing of parametric maps with determination of cerebral blood flow, cerebral blood volume, mean transit time and time-to-maximum. Automated exposure control, iterative reconstruction, and/or weight based adjustment of the mA/kV was utilized to reduce the radiation dose to as low as reasonably achievable. COMPARISON: None available CLINICAL HISTORY: Neuro deficit, acute, stroke suspected. Progressive dizziness today. FINDINGS: CTA NECK: AORTIC ARCH AND ARCH VESSELS: No dissection or arterial injury. No significant stenosis of the brachiocephalic or subclavian arteries. CERVICAL CAROTID ARTERIES: Atherosclerotic calcifications are present within the carotid bifurcations and proximal left ICA bilaterally without significant stenosis. No dissection or arterial injury. CERVICAL VERTEBRAL ARTERIES: No dissection, arterial injury,  or significant stenosis. LUNGS AND MEDIASTINUM: Unremarkable. SOFT TISSUES: No acute abnormality. BONES: No acute  abnormality. CTA HEAD: ANTERIOR CIRCULATION: A 4 mm left ICA terminus aneurysm extends anteriorly and superiorly. No other aneurysm is identified in the anterior circulation. No significant stenosis of the internal carotid arteries. No significant stenosis of the anterior cerebral arteries. No significant stenosis of the middle cerebral arteries. POSTERIOR CIRCULATION: No significant stenosis of the posterior cerebral arteries. No significant stenosis of the basilar artery. No significant stenosis of the vertebral arteries. No aneurysm. OTHER: No dural venous sinus thrombosis on this non-dedicated study. CT PERFUSION: EXAM QUALITY: The perfusion study is nondiagnostic for potential ischemia due to significant patient motion, which resulted in artifactual bilateral CBF of 0 and Tmax of 155. Appropriate arterial inflow and venous outflow curves were not reliably assessed. CORE INFARCT (CBF<30% volume): 0 ml TOTAL HYPOPERFUSION (Tmax>6s volume): Not reliably determined due to nondiagnostic study. The perfusion maps demonstrate bilateral white matter delayed tmax and significant patient motion is present. PENUMBRA: Not reliably determined due to nondiagnostic study. IMPRESSION: 1. No acute large vessel occlusion. 2. Perfusion study is nondiagnostic for potential ischemia due to significant patient motion artifact. No core infarct is suggested. 3. Atherosclerotic calcifications within the carotid bifurcations and proximal left ICA bilaterally without significant stenosis. 4. 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 Electronically signed by: Lonni Necessary MD 11/12/2024 03:44 PM EST RP Workstation: [id number]   CT HEAD CODE STROKE WO CONTRAST` Result Date: 11/12/2024 EXAM: CT HEAD WITHOUT 11/12/2024 02:50:35 PM TECHNIQUE: CT of the head was performed without the administration of intravenous contrast. Automated exposure control, iterative reconstruction, and/or weight based  adjustment of the mA/kV was utilized to reduce the radiation dose to as low as reasonably achievable. COMPARISON: None available. CLINICAL HISTORY: Neuro deficit, acute, stroke suspected. Progressive dizziness today. FINDINGS: BRAIN AND VENTRICLES: Age indeterminate inferior right cerebellar infarct is noted. No acute intracranial hemorrhage. No mass effect or midline shift. No extra-axial fluid collection. Mild atrophy and advanced confluent white matter changes are present bilaterally. Remote lacunar infarcts are present in the thalami bilaterally. Posterior inferior left cerebellar infarcts are noted. No hydrocephalus. ORBITS: No acute abnormality. SINUSES AND MASTOIDS: No acute abnormality. SOFT TISSUES AND SKULL: No acute skull fracture. No acute soft tissue abnormality. [place] Stroke Program Early CT Score (ASPECTS) Ganglionic (caudate, IC, lentiform nucleus, insula, M1-M3): 7 Supraganglionic (M4-M6): 3 Total: 10 IMPRESSION: 1. Age-indeterminate inferior right cerebellar infarct 2. R. emote posterior inferior left cerebellar infarcts. 3. Aspects: 10. 4. Advanced confluent white matter disease bilaterally. 5. Remote bilateral thalamic lacunar infarcts. 6. these results were communicated to Dr. Jerri at 3:01 PM on 11/12/2024 by secure text page via the [facility] messaging system. Electronically signed by: Lonni Necessary MD 11/12/2024 03:03 PM EST RP Workstation: [id number]   DG Chest Port 1 View Result Date: 11/12/2024 CLINICAL DATA:  Dizziness. EXAM: PORTABLE CHEST 1 VIEW COMPARISON:  Chest radiograph dated 02/23/2015. FINDINGS: No focal consolidation, pleural effusion, or pneumothorax. Stable cardiac silhouette. No acute osseous pathology. IMPRESSION: No active disease. Electronically Signed   By: Vanetta Chou M.D.   On: 11/12/2024 13:46     PHYSICAL EXAM  Temp:  [97.7 F (36.5 C)-99.9 F (37.7 C)] 97.7 F (36.5 C) (11/25 0700) Pulse Rate:  [73-103] 82 (11/25 0700) Resp:  [12-26] 13 (11/25  0700) BP: (125-180)/(67-152) 180/80 (11/25 0600) SpO2:  [91 %-100 %] 94 % (11/25 0700) Weight:  [49.8 kg] 49.8 kg (11/25 0500)  General -  Well nourished, well developed, in no apparent distress.  Ophthalmologic - fundi not visualized due to noncooperation.  Cardiovascular - Regular rhythm and rate.  Mental Status -  Level of arousal and orientation to time, place, and person were intact. Language including expression, naming, repetition, comprehension was assessed and found intact. Fund of Knowledge was assessed and was intact.  Cranial Nerves II - XII - II - Visual field intact OU. III, IV, VI - Extraocular movements intact. V - Facial sensation intact bilaterally. VII - Facial movement intact bilaterally. VIII - Hearing & vestibular intact bilaterally. X - Palate elevates symmetrically. XI - Chin turning & shoulder shrug intact bilaterally. XII - Tongue protrusion intact.  Motor Strength - The patient's strength was normal in all extremities and pronator drift was absent.  Bulk was normal and fasciculations were absent.   Motor Tone - Muscle tone was assessed at the neck and appendages and was normal.  Reflexes - The patient's reflexes were symmetrical in all extremities and she had no pathological reflexes.  Sensory - Light touch, temperature/pinprick were assessed and were symmetrical.    Coordination - The patient had normal movements in the hands with no ataxia or dysmetria.  Tremor was absent.  Gait and Station - deferred.   ASSESSMENT/PLAN Ms. Rose Garza is a 78 y.o. female with history of memory loss, hypothyroidism, HTN presenting with dizziness on and off for 4 weeks and then was found to have left upper extremity weakness in ER during cardiology evaluation of hypotension and bradycardia with complete heart block. No TNK given due to symptoms resolved after BP improvement.    Likely recrudescence of late subacute/chronic stroke symptoms in the setting of  hypotension and bradycardia Chronic / late subacute stroke, etiology unclear, small vessel vs. Cardioembolic source Pt stated that started to have vertigo, worse with motion about 4 weeks ago. Comes and goes with nausea. Currently still has motion induced vertigo and nausea.  CT Age-indeterminate inferior right cerebellar infarct, remote posterior inferior left cerebellar infarcts. Remote bilateral thalamic lacunar infarcts.  CTA head and neck Atherosclerotic calcifications within the carotid bifurcations and proximal left ICA bilaterally without significant stenosis. MRI  Remote inferior cerebellar infarcts bilaterally, left more prominent than right. 2D Echo  EF 55-60% EP on board, plan for loop recorder tomorrow. LDL 66 HgbA1c 5.2 SCDs for VTE prophylaxis No antithrombotic prior to admission, now on aspirin  81 mg daily and clopidogrel  75 mg daily DAPT for 3 weeks and then ASA alone Patient counseled to be compliant with her antithrombotic medications Ongoing aggressive stroke risk factor management Therapy recommendations:  CIR Disposition:  pending  Hx of hypertension Hypotension on presentation Home meds: verapamil  BP 70s-80s on presentation S/p IVF and epinephrin Now off pressor and BP stable on the high end Avoid all nodal blocking agents On HTCZ and cozaar  Long term BP goal normotensive  Complete heart block  Bradycardia on presentation Now improved after stopping verapamil EKG showed no more heart block Avoid all nodal blocking agents Cardiology on board No pacer needed at this time  Hyperlipidemia Home meds:  lipitor 10  LDL 66, goal < 70 Now on lipitor 40 Continue statin at discharge  Cerebral aneurysm CTA showed 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 No intervention now Will follow up as outpt with Dr. Lester IR  Other Stroke Risk Factors Advanced age  Other Active Problems AKI, Cre  1.51--1.07--0.86 Hypothyroidism  Hospital day # 2  Neurology will  sign off. Please call with questions. Pt will follow up with stroke clinic NP at [facility] in about 4 weeks. Thanks for the consult.   Ary Cummins, MD PhD Stroke Neurology 11/14/2024 8:17 AM    To contact Stroke Continuity provider, please refer to [email or web address]. After hours, contact General Neurology

## 2024-11-14 NOTE — Progress Notes (Signed)
  Patient Name: Rose Garza Date of Encounter: 11/14/2024  Primary Cardiologist: None Electrophysiologist: None  Interval Summary   NAEO. BP remains high. Neuro deficits have resolved.  Maintaning NSR  Vital Signs    Vitals:   11/14/24 0400 11/14/24 0500 11/14/24 0600 11/14/24 0700  BP: (!) 169/81 (!) 178/152 (!) 180/80   Pulse: 86 73 78 82  Resp: 19 13 15 13   Temp:      TempSrc:      SpO2: 91% 92% 94% 94%  Weight:  49.8 kg    Height:        Intake/Output Summary (Last 24 hours) at 11/14/2024 0713 Last data filed at 11/14/2024 9385 Gross per 24 hour  Intake --  Output 1900 ml  Net -1900 ml   Filed Weights   11/12/24 1323 11/13/24 0500 11/14/24 0500  Weight: 68 kg 56.6 kg 49.8 kg    Physical Exam    GEN- NAD, Alert and oriented  Lungs- Clear to ausculation bilaterally, normal work of breathing Cardiac- Regular rate and rhythm, no murmurs, rubs or gallops GI- soft, NT, ND, + BS Extremities- no clubbing or cyanosis. No edema  Telemetry    NSR 70-80s (personally reviewed)  Hospital Course    70F with HTN, CKD, HLD and hypothyroidism who presents with 3-week history of dizziness, lightheadedness, generalized malaise and fatigue. Cardiology is consulted for CHB with ventricular escape in the 30s.   Assessment & Plan    CHB Resolved with washout of verapamil  HTN Increase losartan  and follow.  Acute CVA On ASA + Plavix .  Monitor vs loop at discharge.  Neuro following.   PT to eval and make recommendations.  Will transfer to floor and potentially plan home tomorrow +/- loop pending course.    For questions or updates, please contact [facility] Please consult [email or web address] for contact info under     Signed, Ozell Prentice Passey, PA-C  11/14/2024, 7:13 AM

## 2024-11-14 NOTE — Progress Notes (Signed)
 Inpatient Rehab Admissions Coordinator Note:   Per therapy recommendations patient was screened for CIR candidacy by Reche FORBES Lowers, PT. At this time, pt appears to be a potential candidate for CIR. I will place an order for rehab consult for full assessment, per our protocol.  Please contact me any with questions.SABRA Reche Lowers, PT, DPT [phone] 11/14/24 3:26 PM

## 2024-11-14 NOTE — TOC Progression Note (Signed)
 Transition of Care [facility]) - Progression Note    Patient Details  Name: Rose Garza MRN: [id number] Date of Birth: 1946/05/05  [facility]) CM/SW Contact  Lauraine FORBES Saa, LCSWA Phone Number: 11/14/2024, 4:00 PM  Clinical Narrative:     4:01 PM Per chart review, PT recommended patient discharge to [facility]. [facility] CIR admissions to place rehab consult for potential admission. TOC will continue to follow.  Expected Discharge Plan: IP Rehab Facility Barriers to Discharge: Continued Medical Work up, [employer] and Services   Discharge Planning Services: CM Consult   Living arrangements for the past 2 months: Single Family Home                                       Social Drivers of Health (SDOH) Interventions SDOH Screenings   Food Insecurity: No Food Insecurity (11/12/2024)  Housing: Low Risk  (11/12/2024)  Transportation Needs: No Transportation Needs (11/12/2024)  Utilities: Not At Risk (11/12/2024)  Recent Concern: Utilities - At Risk (10/18/2024)   Received from [facility]  Financial Resource Strain: Low Risk  (10/18/2024)   Received from [facility]  Physical Activity: Inactive (10/18/2024)   Received from [facility]  Social Connections: Moderately Integrated (11/12/2024)  Stress: No Stress Concern Present (10/18/2024)   Received from [facility]  Tobacco Use: Low Risk  (11/12/2024)    Readmission Risk Interventions     No data to display

## 2024-11-15 ENCOUNTER — Encounter (HOSPITAL_COMMUNITY): Payer: Self-pay | Admitting: Student in an Organized Health Care Education/Training Program

## 2024-11-15 ENCOUNTER — Other Ambulatory Visit (HOSPITAL_COMMUNITY): Payer: Self-pay

## 2024-11-15 ENCOUNTER — Encounter (HOSPITAL_COMMUNITY)
Admission: EM | Disposition: A | Payer: Self-pay | Source: Home / Self Care | Attending: Student in an Organized Health Care Education/Training Program

## 2024-11-15 DIAGNOSIS — I10 Essential (primary) hypertension: Secondary | ICD-10-CM

## 2024-11-15 DIAGNOSIS — R001 Bradycardia, unspecified: Secondary | ICD-10-CM | POA: Diagnosis not present

## 2024-11-15 DIAGNOSIS — I442 Atrioventricular block, complete: Secondary | ICD-10-CM | POA: Diagnosis not present

## 2024-11-15 HISTORY — PX: LOOP RECORDER INSERTION: EP1214

## 2024-11-15 LAB — BASIC METABOLIC PANEL WITH GFR
Anion gap: 11 (ref 5–15)
BUN: 14 mg/dL (ref 8–23)
CO2: 19 mmol/L — ABNORMAL LOW (ref 22–32)
Calcium: 8 mg/dL — ABNORMAL LOW (ref 8.9–10.3)
Chloride: 107 mmol/L (ref 98–111)
Creatinine, Ser: 0.92 mg/dL (ref 0.44–1.00)
GFR, Estimated: >60 mL/min (ref >60)
Glucose, Bld: 82 mg/dL (ref 70–99)
Potassium: 3.4 mmol/L — ABNORMAL LOW (ref 3.5–5.1)
Sodium: 137 mmol/L (ref 135–145)

## 2024-11-15 SURGERY — LOOP RECORDER INSERTION

## 2024-11-15 MED ORDER — ATORVASTATIN CALCIUM 40 MG PO TABS
40.0000 mg | ORAL_TABLET | Freq: Every day | ORAL | 3 refills | Status: AC
  Filled 2024-11-15: qty 90, 90d supply, fill #0

## 2024-11-15 MED ORDER — LIDOCAINE-EPINEPHRINE 1 %-1:100000 IJ SOLN
INTRAMUSCULAR | Status: AC
  Filled 2024-11-15: qty 1

## 2024-11-15 MED ORDER — CLOPIDOGREL BISULFATE 75 MG PO TABS
75.0000 mg | ORAL_TABLET | Freq: Every day | ORAL | 0 refills | Status: AC
  Filled 2024-11-15: qty 21, 21d supply, fill #0

## 2024-11-15 MED ORDER — LIDOCAINE-EPINEPHRINE 1 %-1:100000 IJ SOLN
INTRAMUSCULAR | Status: DC | PRN
  Administered 2024-11-15: 20 mL

## 2024-11-15 MED ORDER — AMLODIPINE BESYLATE 10 MG PO TABS
10.0000 mg | ORAL_TABLET | Freq: Every day | ORAL | 6 refills | Status: AC
  Filled 2024-11-15: qty 30, 30d supply, fill #0

## 2024-11-15 MED ORDER — ASPIRIN 81 MG PO TBEC
81.0000 mg | DELAYED_RELEASE_TABLET | Freq: Every day | ORAL | 12 refills | Status: AC
  Filled 2024-11-15: qty 30, 30d supply, fill #0

## 2024-11-15 MED ORDER — AMLODIPINE BESYLATE 10 MG PO TABS
10.0000 mg | ORAL_TABLET | Freq: Every day | ORAL | Status: DC
  Administered 2024-11-15: 10 mg via ORAL
  Filled 2024-11-15: qty 1

## 2024-11-15 MED ORDER — LOSARTAN POTASSIUM-HCTZ 100-25 MG PO TABS
1.0000 | ORAL_TABLET | Freq: Every day | ORAL | 11 refills | Status: AC
  Filled 2024-11-15: qty 30, 30d supply, fill #0

## 2024-11-15 MED ORDER — ACETAMINOPHEN 325 MG PO TABS: 650.0000 mg | ORAL_TABLET | ORAL | Status: AC | PRN

## 2024-11-15 SURGICAL SUPPLY — 2 items
MONITOR CARDIAC ASSERT IQ EL (Prosthesis & Implant Heart) IMPLANT
PACK LOOP INSERTION (CUSTOM PROCEDURE TRAY) ×1 IMPLANT

## 2024-11-15 NOTE — Discharge Instructions (Addendum)
 Care After Your Loop Recorder  You have a Abbott Loop Recorder   Monitor your cardiac device site for redness, swelling, and drainage. Call the device clinic at [phone] if you experience these symptoms or fever/chills.  If you notice bleeding from your site, hold firm, but gently pressure with two fingers for 5 minutes. Dried blood on the steri-strips when removing the outer bandage is normal.   Keep the large square bandage on your site for 24 hours and then you may remove it yourself. Keep the steri-strips underneath in place.   If your wound was closed with steri-strips You may shower after 72 hours / 3 days from your procedure. They will usually fall off on their own, or may be removed after 10 days. Pat dry.  If your wound was closed with glue/dermabond, You may shower 24 hours after your procedure  Avoid lotions, ointments, or perfumes over your incision until it is well-healed.  Please do not submerge in water until your site is completely healed.   Your device is MRI compatible.   Remote monitoring is used to monitor your cardiac device from home. This monitoring is scheduled every month by our office. It allows us  to keep an eye on the function of your device to ensure it is working properly.

## 2024-11-15 NOTE — TOC Transition Note (Signed)
 Transition of Care [facility]) - Discharge Note   Patient Details  Name: Rose Garza MRN: [id number] Date of Birth: 05/07/1946  Transition of Care [facility]) CM/SW Contact:  Roxie KANDICE Stain, RN Phone Number: 11/15/2024, 2:35 PM   Clinical Narrative:    Patient is stable for discharge.  Patient is agreeable to OP rehab and requests [address] location.  Referral sent to [facility] [address]  Address, Phone number and PCP verified.    Final next level of care: OP Rehab Barriers to Discharge: Barriers Resolved   Patient Goals and CMS Choice Patient states their goals for this hospitalization and ongoing recovery are:: to get better and go home          Discharge Placement             home          Discharge Plan and Services Additional resources added to the After Visit Summary for     Discharge Planning Services: CM Consult                                 Social Drivers of Health (SDOH) Interventions SDOH Screenings   Food Insecurity: No Food Insecurity (11/12/2024)  Housing: Low Risk  (11/12/2024)  Transportation Needs: No Transportation Needs (11/12/2024)  Utilities: Not At Risk (11/12/2024)  Recent Concern: Utilities - At Risk (10/18/2024)   Received from [facility]  Financial Resource Strain: Low Risk  (10/18/2024)   Received from [facility]  Physical Activity: Inactive (10/18/2024)   Received from [facility]  Social Connections: Moderately Integrated (11/12/2024)  Stress: No Stress Concern Present (10/18/2024)   Received from [facility]  Tobacco Use: Low Risk  (11/15/2024)     Readmission Risk Interventions    11/15/2024    2:35 PM  Readmission Risk Prevention Plan  Post Dischage Appt Complete  Medication Screening Complete  Transportation Screening Complete

## 2024-11-15 NOTE — Progress Notes (Signed)
   Inpatient Rehabilitation Admissions Coordinator   Noted updated therapy recommendations. She is no longer in need of CIR level rehab. I will alert TOC. We will sign off.  Heron Leavell, RN, MSN Rehab Admissions Coordinator [phone] 11/15/2024 11:50 AM

## 2024-11-15 NOTE — Interval H&P Note (Signed)
 History and Physical Interval Note:  11/15/2024 10:06 AM  Rose Garza  has presented today for surgery, with the diagnosis of bradicardia.  The various methods of treatment have been discussed with the patient and family. After consideration of risks, benefits and other options for treatment, the patient has consented to  Procedure(s): LOOP RECORDER INSERTION (N/A) as a surgical intervention.  The patient's history has been reviewed, patient examined, no change in status, stable for surgery.  I have reviewed the patient's chart and labs.  Questions were answered to the patient's satisfaction.     [name]

## 2024-11-15 NOTE — Progress Notes (Signed)
 Physical Therapy Treatment Patient Details Name: Rose Garza MRN: [id number] DOB: 04-24-1946 Today's Date: 11/15/2024   History of Present Illness 78 y.o. female presenting 11/23 with dizziness on and off for 4 weeks found to have left upper extremity weakness in ER during cardiology evaluation of hypotension and bradycardia with complete heart block. Likely recrudescence of late subacute/chronic stroke symptoms in the setting of hypotension and bradycardia  Chronic / late subacute stroke. Loop recorder placed 11/26. PMH: memory loss, hypothyroidism, HTN    PT Comments  Pt with significant improvement in mobility today. Likely she is back to baseline for mobility. Has been receiving OPPT in [place] for baseline balance but does not want to continue at that location. Recommend OPPT at another location if pt is agreeable.     If plan is discharge home, recommend the following: Help with stairs or ramp for entrance   Can travel by private vehicle        Equipment Recommendations  None recommended by PT (pt states she has rolling walker)    Recommendations for Other Services       Precautions / Restrictions Precautions Precautions: Fall Recall of Precautions/Restrictions: Intact Precaution/Restrictions Comments: pt reported no falls at home Restrictions Weight Bearing Restrictions Per Provider Order: No     Mobility  Bed Mobility Overal bed mobility: Modified Independent                  Transfers Overall transfer level: Modified independent Equipment used: Rolling walker (2 wheels), None Transfers: Sit to/from Stand                  Ambulation/Gait Ambulation/Gait assistance: Supervision Gait Distance (Feet): 200 Feet Assistive device: Rolling walker (2 wheels), None Gait Pattern/deviations: Step-through pattern, Decreased stride length   Gait velocity interpretation: >2.62 ft/sec, indicative of community ambulatory   General Gait Details: Amb with  and without rolling walker without any loss of balance. No signs of ataxia.   Stairs Stairs: Yes Stairs assistance: Contact guard assist Stair Management: One rail Right, Step to pattern Number of Stairs: 1     Wheelchair Mobility     Tilt Bed    Modified Rankin (Stroke Patients Only) Modified Rankin (Stroke Patients Only) Pre-Morbid Rankin Score: No significant disability Modified Rankin: Slight disability     Balance Overall balance assessment: Needs assistance   Sitting balance-Leahy Scale: Normal     Standing balance support: No upper extremity supported, During functional activity Standing balance-Leahy Scale: Good               High level balance activites: Sudden stops, Turns              Communication Communication Communication: Impaired Factors Affecting Communication: Hearing impaired  Cognition Arousal: Alert Behavior During Therapy: WFL for tasks assessed/performed   PT - Cognitive impairments: Memory                       PT - Cognition Comments: Likely baseline Following commands: Intact      Cueing Cueing Techniques: Verbal cues  Exercises      General Comments General comments (skin integrity, edema, etc.): HR to 120's with amb      Pertinent Vitals/Pain Pain Assessment Pain Assessment: No/denies pain    Home Living                          Prior Function  PT Goals (current goals can now be found in the care plan section) Acute Rehab PT Goals Patient Stated Goal: Go home PT Goal Formulation: With patient Time For Goal Achievement: 11/21/24 Potential to Achieve Goals: Good Progress towards PT goals: Goals met and updated - see care plan    Frequency    Min 3X/week      PT Plan      Co-evaluation              AM-PAC PT 6 Clicks Mobility   Outcome Measure  Help needed turning from your back to your side while in a flat bed without using bedrails?: None Help needed  moving from lying on your back to sitting on the side of a flat bed without using bedrails?: None Help needed moving to and from a bed to a chair (including a wheelchair)?: None Help needed standing up from a chair using your arms (e.g., wheelchair or bedside chair)?: None Help needed to walk in hospital room?: A Little Help needed climbing 3-5 steps with a railing? : A Little 6 Click Score: 22    End of Session   Activity Tolerance: Patient tolerated treatment well Patient left: in bed;with call bell/phone within reach;with bed alarm set Nurse Communication: Mobility status PT Visit Diagnosis: Other abnormalities of gait and mobility (R26.89)     Time: 8885-8866 PT Time Calculation (min) (ACUTE ONLY): 19 min  Charges:    $Gait Training: 8-22 mins PT General Charges $$ ACUTE PT VISIT: 1 Visit                     [facility] PT Acute Rehabilitation Services Office [phone]    Rodgers ORN [facility] 11/15/2024, 11:48 AM

## 2024-11-15 NOTE — Progress Notes (Signed)
 Went over discharge paperwork with patient and daughter in law. All questions answered. PIV and telemetry removed. All belongings at bedside.

## 2024-11-15 NOTE — Evaluation (Signed)
 Occupational Therapy Evaluation Patient Details Name: Rose Garza MRN: [id number] DOB: [birth year] Today's Date: 11/15/2024   History of Present Illness   78 y.o. female presenting 11/23 with dizziness on and off for 4 weeks found to have left upper extremity weakness in ER during cardiology evaluation of hypotension and bradycardia with complete heart block. Likely recrudescence of late subacute/chronic stroke symptoms in the setting of hypotension and bradycardia  Chronic / late subacute stroke. PMH: memory loss, hypothyroidism, HTN     Clinical Impressions Pt reported at PLOF they were indep living alone but they have 2 sons who live very close who can assist as needed with the return home and/or can stay in the home when they first return home. At this time she was mod I for bed mobility, CGA for sit to stand transfers to RW and CGA to supervision while ambulating with RW and pt completed light wash up while seated at the sink with set up. At this time patient is reporting they do not want to go to CIR and would like to go home. At this time would recommend Home Health Therapy but may progress to OP level. Acute Occupational Therapy to follow.      If plan is discharge home, recommend the following:   Assistance with cooking/housework;Assist for transportation;Help with stairs or ramp for entrance     Functional Status Assessment   Patient has had a recent decline in their functional status and demonstrates the ability to make significant improvements in function in a reasonable and predictable amount of time.     Equipment Recommendations   None recommended by OT     Recommendations for Other Services         Precautions/Restrictions   Precautions Precautions: Fall Recall of Precautions/Restrictions: Intact Precaution/Restrictions Comments: pt reported no falls at home Restrictions Weight Bearing Restrictions Per Provider Order: No     Mobility Bed  Mobility Overal bed mobility: Modified Independent             General bed mobility comments: no assist, per pt reported bed at home [facility] can raise    Transfers Overall transfer level: Needs assistance Equipment used: Rolling walker (2 wheels) Transfers: Sit to/from Stand Sit to Stand: Contact guard assist           General transfer comment: cues with sequencing for saftey      Balance Overall balance assessment: Needs assistance Sitting-balance support: Feet supported Sitting balance-Leahy Scale: Good     Standing balance support: No upper extremity supported, Bilateral upper extremity supported Standing balance-Leahy Scale: Fair Standing balance comment: pt reported feeling steady today                           ADL either performed or assessed with clinical judgement   ADL Overall ADL's : Needs assistance/impaired Eating/Feeding: Independent;Sitting   Grooming: Wash/dry hands;Wash/dry face;Oral care;Set up;Sitting   Upper Body Bathing: Set up;Sitting   Lower Body Bathing: Set up;Sitting/lateral leans   Upper Body Dressing : Set up;Sitting   Lower Body Dressing: Set up;Sit to/from stand;Sitting/lateral leans   Toilet Transfer: Contact guard assist;Rolling walker (2 wheels)   Toileting- Clothing Manipulation and Hygiene: Contact guard assist;Sit to/from stand   Tub/ Shower Transfer: Contact guard assist;Cueing for safety;Cueing for sequencing   Functional mobility during ADLs: Contact guard assist;Rolling walker (2 wheels);Cueing for sequencing;Cueing for safety       Vision Baseline Vision/History: 0 No visual deficits Ability  to See in Adequate Light: 0 Adequate Patient Visual Report: No change from baseline Vision Assessment?: No apparent visual deficits     Perception Perception: Within Functional Limits       Praxis Praxis: WFL       Pertinent Vitals/Pain Pain Assessment Pain Assessment: No/denies pain     Extremity/Trunk  Assessment Upper Extremity Assessment Upper Extremity Assessment: Overall WFL for tasks assessed (noted slight limitations in shoulder flextion to about 130 deg but WFL to complete ADLs)   Lower Extremity Assessment Lower Extremity Assessment: Defer to PT evaluation   Cervical / Trunk Assessment Cervical / Trunk Assessment: Normal   Communication Communication Communication: Impaired Factors Affecting Communication: Hearing impaired   Cognition Arousal: Alert Behavior During Therapy: WFL for tasks assessed/performed Cognition: No apparent impairments                               Following commands: Intact       Cueing  General Comments   Cueing Techniques: Verbal cues  o2 88-92% Max HR 110 PRE BP: 176/91 (115) post 167/97 (118)   Exercises     Shoulder Instructions      Home Living Family/patient expects to be discharged to:: Private residence Living Arrangements: Alone Available Help at Discharge: Family;Available PRN/intermittently Type of Home: House Home Access: Stairs to enter [employer] of Steps: 1 (or in the front 3 steps with bil rails and 1 step is through the garage) Entrance Stairs-Rails: None Home Layout: Two level;Able to live on main level with bedroom/bathroom     Bathroom Shower/Tub: Producer, Television/film/video: Handicapped height Bathroom Accessibility: Yes How Accessible: Accessible via walker Home Equipment: Cane - single point;Shower seat - built in;Wheelchair - Forensic Psychologist (2 wheels)          Prior Functioning/Environment Prior Level of Function : Independent/Modified Independent;Driving             Mobility Comments: Denies falls or using AD but reports frequent LOB. Was going to OPPT for balance. ADLs Comments: ind    OT Problem List: Decreased strength;Decreased activity tolerance;Decreased knowledge of use of DME or AE   OT Treatment/Interventions: Self-care/ADL training;Therapeutic  exercise;Therapeutic activities;Patient/family education;Balance training      OT Goals(Current goals can be found in the care plan section)   Acute Rehab OT Goals Patient Stated Goal: to go home OT Goal Formulation: With patient Time For Goal Achievement: 11/29/24 Potential to Achieve Goals: Good   OT Frequency:  Min 2X/week    Co-evaluation              AM-PAC OT 6 Clicks Daily Activity     Outcome Measure Help from another person eating meals?: None Help from another person taking care of personal grooming?: None Help from another person toileting, which includes using toliet, bedpan, or urinal?: A Little Help from another person bathing (including washing, rinsing, drying)?: A Little Help from another person to put on and taking off regular upper body clothing?: None Help from another person to put on and taking off regular lower body clothing?: A Little 6 Click Score: 21   End of Session Equipment Utilized During Treatment: Gait belt;Rolling walker (2 wheels) Nurse Communication: Mobility status  Activity Tolerance: Patient tolerated treatment well Patient left: in chair;with call bell/phone within reach  OT Visit Diagnosis: Unsteadiness on feet (R26.81);Other abnormalities of gait and mobility (R26.89);Muscle weakness (generalized) (M62.81)  Time: 0731-0803 OT Time Calculation (min): 32 min Charges:  OT General Charges $OT Visit: 1 Visit OT Evaluation $OT Eval Low Complexity: 1 Low OT Treatments $Self Care/Home Management : 8-22 mins  Warrick POUR OTR/L  Acute Rehab Services  [phone] office number   Warrick Berber 11/15/2024, 8:12 AM

## 2024-11-15 NOTE — Discharge Summary (Signed)
 ELECTROPHYSIOLOGY DISCHARGE SUMMARY    Patient ID: Rose Garza,  MRN: [id number], DOB/AGE: Mar 29, 1946 78 y.o.  Admit date: 11/12/2024 Discharge date: 11/15/2024  Primary Care Physician: Loreli Kins, MD  Primary Cardiologist: None  Electrophysiologist: Dr. Almetta   Primary Discharge Diagnosis:  Intermittent CHB  Secondary Discharge Diagnosis:  Acute CVA HTN  Procedures This Admission:  CT Age-indeterminate inferior right cerebellar infarct, remote posterior inferior left cerebellar infarcts. Remote bilateral thalamic lacunar infarcts.  CTA head and neck Atherosclerotic calcifications within the carotid bifurcations and proximal left ICA bilaterally without significant stenosis. MRI  Remote inferior cerebellar infarcts bilaterally, left more prominent than right. 2D Echo  EF 55-60% Abbott Loop recorder implantation 11/15/2024      Brief HPI: SAMMANTHA Garza is a 78 y.o. female with a history of CKD, HTN, HLD, and hypothyroidism admitted for 3 week history of dizziness, lightheadedness, generalized malaise, and fatigue.   Hospital Course:  The patient was admitted for above symptoms and found to be in CHB. On initial exam by MD, pt began to show signs of CVA, and Code stroke was called. Work up showed indeterminate inferior right cerebellar infarct, remote posterior inferior left cerebellar infarcts. Remote bilateral thalamic lacunar infarcts.  Neurology followed. Her conduction improved with verapamil washout, and she had no further bradycardia.   They were monitored on telemetry overnight which demonstrated NSR. PT initially recommended CIR, but pt improved and preferred to go directly home.  Loop recorder placed for both monitoring of her HRs and surveillance for AF in setting of CVA.   The patient was examined and considered to be stable for discharge.  Wound care and restrictions were reviewed with the patient.  The patient will be seen back by EP Team in 3-4 weeks  for post hospital care.   Physical Exam: Vitals:   11/14/24 2255 11/15/24 0616 11/15/24 0731 11/15/24 1146  BP: (!) 186/98 (!) 187/97 (!) 176/91 (!) 146/75  Pulse: 93 88 84 99  Resp: 19 15 20 20   Temp: 98.5 F (36.9 C) 98.4 F (36.9 C) 98.7 F (37.1 C) 98.4 F (36.9 C)  TempSrc: Oral Oral Oral Oral  SpO2: 93% 93% 93% 90%  Weight:  54.9 kg    Height:        GEN- NAD. A&O x 3.  HEENT: Normocephalic, atraumatic Lungs- CTAB, Normal effort.  Heart- RRR, No M/G/R.  GI- Soft, NT, ND.  Extremities- No clubbing, cyanosis, or edema; Groin without hematoma   Discharge Medications:  Allergies as of 11/15/2024       Reactions   Bactrim  Ds [sulfamethoxazole -trimethoprim ] Nausea Only, Swelling   Codeine Nausea And Vomiting   Xanax [alprazolam]    headache        Medication List     STOP taking these medications    verapamil 240 MG CR tablet Commonly known as: CALAN-SR       TAKE these medications    acetaminophen  325 MG tablet Commonly known as: TYLENOL  Take 2 tablets (650 mg total) by mouth every 4 (four) hours as needed for headache or mild pain (pain score 1-3).   amLODipine  10 MG tablet Commonly known as: NORVASC  Take 1 tablet (10 mg total) by mouth daily. Start taking on: November 16, 2024   aspirin  EC 81 MG tablet Take 1 tablet (81 mg total) by mouth daily. Swallow whole. Start taking on: November 16, 2024   atorvastatin  40 MG tablet Commonly known as: LIPITOR Take 1 tablet (40 mg total) by  mouth daily. Start taking on: November 16, 2024 What changed:  medication strength how much to take   citalopram  20 MG tablet Commonly known as: CELEXA  Take 20 mg by mouth daily.   clopidogrel  75 MG tablet Commonly known as: PLAVIX  Take 1 tablet (75 mg total) by mouth daily for 21 days. Start taking on: November 16, 2024   losartan -hydrochlorothiazide  100-25 MG tablet Commonly known as: Hyzaar  Take 1 tablet by mouth daily.   raloxifene 60 MG  tablet Commonly known as: EVISTA Take 60 mg by mouth daily.   Synthroid  100 MCG tablet Generic drug: levothyroxine  Take 100 mcg by mouth daily.   VITAMIN D PO Take 1,000 Units by mouth daily.        Disposition: Home with usual follow up as in AVS  Duration of Discharge Encounter:  APP time: 26 minutes  Signed, Ozell Prentice Passey, PA-C  11/15/2024 1:26 PM

## 2024-11-15 NOTE — Progress Notes (Addendum)
  No further bradycardia.  Plan for loop this am for chronic monitoring of rhythm, and for monitoring for AF in setting of CVA.   Disposition and full note pending PT recommendations  Ozell Jodie Passey, PA-C  11/15/2024 8:10 AM

## 2024-11-17 LAB — CULTURE, BLOOD (ROUTINE X 2)
Culture: NO GROWTH
Culture: NO GROWTH
Special Requests: ADEQUATE
Special Requests: ADEQUATE

## 2024-11-19 NOTE — Op Note (Signed)
   Procedure:  Implantable Loop Monitor Implant   Indication: cryptogenic stroke  Attending: Adina Primus, MD   Method: The patient was brought to the cardiac catheterization laboratory in the fasting, post-absorptive state. A time out was performed. The chest was prepped with chlorhexidine  scrub. Lidocaine  was administered at the planned incision site.  A 1 cm incision was made and the ILR was inserted with the implant tool. Adequate sensing was confirmed with the interrogator. The skin was closed with Dermabond. An adhesive dressing was applied over the incision.   Implanted Hardware: Implant Name Type Inv. Item Serial No. Manufacturer Lot No. LRB No. Used Action  MONITOR CARDIAC ASSERT IQ EL - [id number] Prosthesis & Implant Heart MONITOR CARDIAC ASSERT IQ EL [id number] ABBOTT VASC[employer] DEVICES  N/A 1 Implanted   Device information:  ILR: Abbott Assert-IQ EL+ ICM 5500, SN: [id number], DOI 11/15/24  Summary: 1. Successful implant of a Abbott ILR    Recommendations: 1. Tx back to IP room 2. Discharge home  2. F/U EP clinic as scheduled  [name], MD [facility]  Cardiac Electrophysiology

## 2024-11-20 ENCOUNTER — Encounter (HOSPITAL_COMMUNITY): Payer: Self-pay | Admitting: Student in an Organized Health Care Education/Training Program

## 2024-11-21 ENCOUNTER — Telehealth: Payer: Self-pay | Admitting: Neuroradiology

## 2024-11-21 NOTE — Telephone Encounter (Signed)
 Left VM for patient to call back and schedule 4 week follow-up per Dr. Wendall request.

## 2024-12-11 ENCOUNTER — Other Ambulatory Visit (HOSPITAL_COMMUNITY): Payer: Self-pay

## 2024-12-16 ENCOUNTER — Ambulatory Visit: Attending: Student in an Organized Health Care Education/Training Program

## 2024-12-16 DIAGNOSIS — I442 Atrioventricular block, complete: Secondary | ICD-10-CM | POA: Diagnosis not present

## 2024-12-18 LAB — CUP PACEART REMOTE DEVICE CHECK
Date Time Interrogation Session: 20251227055108
Implantable Pulse Generator Implant Date: 20251126
Pulse Gen Model: 5000
Pulse Gen Serial Number: 511095713

## 2024-12-20 ENCOUNTER — Ambulatory Visit: Payer: Self-pay | Admitting: Student in an Organized Health Care Education/Training Program

## 2024-12-20 NOTE — Progress Notes (Unsigned)
" ° °  Electrophysiology Office Note:   Date:  12/20/2024  ID:  Rose Garza, DOB 1946/01/03, MRN [id number]  Primary Cardiologist: None Electrophysiologist: None  {Click to update primary MD,subspecialty MD or APP then REFRESH:1}    History of Present Illness:   Rose Garza is a 78 y.o. female with h/o CKD, HTN, HLD, and hypothyroidism  seen today for post hospital follow up.    Admitted 11/23-11/26/2025. Presented with dizziness, lightheadedness, generalized malaise, and fatigue. Found to be in CHB but also with acute CVA. As her CVA was treated, her heart block improved without recurrence. Given CVA and bradycardia, loop was implanted for cardiac monitoring.   Since discharge from hospital the patient reports doing ***.  she denies chest pain, palpitations, dyspnea, PND, orthopnea, nausea, vomiting, dizziness, syncope, edema, weight gain, or early satiety.     Review of systems complete and found to be negative unless listed in HPI.   Studies Reviewed:    EKG is not ordered today.       ***  Arrhythmia/Device History [place] loop 11/15/2024 for bradycardia and AF monitoring s/p CVA   Physical Exam:   VS:  LMP 12/21/1986    Wt Readings from Last 3 Encounters:  11/15/24 121 lb 1.6 oz (54.9 kg)  04/29/21 150 lb (68 kg)  07/14/19 159 lb (72.1 kg)     GEN: No acute distress NECK: No JVD; No carotid bruits CARDIAC: {EPRHYTHM:28826}, no murmurs, rubs, gallops RESPIRATORY:  Clear to auscultation without rales, wheezing or rhonchi  ABDOMEN: Soft, non-tender, non-distended EXTREMITIES:  {EDEMA LEVEL:28147::No} edema; No deformity   ASSESSMENT AND PLAN:    {Blank single:19197::Cryptogenic Stroke,Palpitations,Syncope,Atrial fibrillation} s/p {INDUSTRY:28136} Loop recorder Normal device function by website No changes today ***  {Click here to Review PMH, Prob List, Meds, Allergies, SHx, FHx  :1}   Follow up with {EPMDS:28135::EP Team} {EPFOLLOW  UP:28173}  Signed, Ozell Prentice Passey, PA-C  "

## 2024-12-22 ENCOUNTER — Encounter: Payer: Self-pay | Admitting: Student

## 2024-12-22 ENCOUNTER — Ambulatory Visit: Attending: Student | Admitting: Student

## 2024-12-22 VITALS — BP 90/62 | HR 93 | Ht 62.0 in | Wt 115.0 lb

## 2024-12-22 DIAGNOSIS — Z8673 Personal history of transient ischemic attack (TIA), and cerebral infarction without residual deficits: Secondary | ICD-10-CM | POA: Diagnosis not present

## 2024-12-22 DIAGNOSIS — I10 Essential (primary) hypertension: Secondary | ICD-10-CM | POA: Diagnosis not present

## 2024-12-22 DIAGNOSIS — R001 Bradycardia, unspecified: Secondary | ICD-10-CM

## 2024-12-22 NOTE — Patient Instructions (Signed)
 Medication Instructions:  No medication changes today. *If you need a refill on your cardiac medications before your next appointment, please call your pharmacy*  Lab Work: No labwork ordered today. If you have labs (blood work) drawn today and your tests are completely normal, you will receive your results only by: MyChart Message (if you have MyChart) OR A paper copy in the mail If you have any lab test that is abnormal or we need to change your treatment, we will call you to review the results.  Testing/Procedures: No testing ordered today  Follow-Up: At [facility], you and your health needs are our priority.  As part of our continuing mission to provide you with exceptional heart care, our providers are all part of one team.  This team includes your primary Cardiologist (physician) and Advanced Practice Providers or APPs (Physician Assistants and Nurse Practitioners) who all work together to provide you with the care you need, when you need it.  Your next appointment:   6 month(s)  Provider:   You may see Rose DELENA Primus, MD or one of the following Advanced Practice Providers on your designated Care Team:   Rose Arthur, PA-C Rose Garza Rose Alexios Keown, PA-C Rose Riddle, NP Rose Barrack, NP    We recommend signing up for the patient portal called MyChart.  Sign up information is provided on this After Visit Summary.  MyChart is used to connect with patients for Virtual Visits (Telemedicine).  Patients are able to view lab/test results, encounter notes, upcoming appointments, etc.  Non-urgent messages can be sent to your provider as well.   To learn more about what you can do with MyChart, go to [email or web address].

## 2024-12-25 NOTE — Progress Notes (Signed)
 Remote Loop Recorder Transmission

## 2024-12-26 ENCOUNTER — Encounter: Payer: Self-pay | Admitting: Neurology

## 2024-12-26 ENCOUNTER — Ambulatory Visit: Admitting: Neurology

## 2024-12-26 VITALS — BP 113/73 | HR 86 | Ht 62.0 in | Wt 116.0 lb

## 2024-12-26 DIAGNOSIS — G3184 Mild cognitive impairment, so stated: Secondary | ICD-10-CM

## 2024-12-26 DIAGNOSIS — I639 Cerebral infarction, unspecified: Secondary | ICD-10-CM

## 2024-12-26 NOTE — Progress Notes (Signed)
 " [facility] [address] [place]. [place] [address] [phone]       OFFICE CONSULT NOTE  Ms. Rose Garza Date of Birth:  [birth year] Medical Record Number:  [id number]   Referring MD: Ary Cummins  Reason for Referral: Stroke  HPI: Rose Garza is a pleasant 79 year old Caucasian lady seen today for initial office consultation visit.  She is accompanied by her son.  History is obtained from them and review of electronic medical records.  I personally reviewed pertinent available imaging films in PACS. Rose Garza is a 79 y.o. female with hx of memory loss, hypothyroidism, HTN, CKD 3a presenting with dizziness. She stated tahat she has been dizzy with room spinning and head heavy since October 2025.  Dizziness comes and goes, not sure about triggers. She was in church on 11/12/2024 morning, felt dizzy again, she went to bathroom and was nausea and vomited. One of her church friends was RN and checked her HR 40s and BP 78/52. EMS was called and she was sent to ER for evaluation.  En route she received 500 cc of fluid, Zofran , and atropine.  On arrival to the ED she was again found to be hypotensive77/46 and bradycardic at 39 with a complete heart block. She was given another liter of fluids with improvement of blood pressure systolic to 90 and heart rate around 35.  Cardiology noted acute left arm weakness and drift and a code stroke was activated. She again reported fatigue, nausea, and dizziness.  The symptoms do get worse with movement and she did have 2 episodes of dry heaving while in CT.  In reviewing her records from cardiology she was on verapamil 240 mg twice daily.  Previous EKGs noted in her cardiology notes do show her baseline heart rate between 55 and 64 bpm.  She does follow with [facility] cardiology and audiology.  She denies a history of stroke.  Epinephrine  also started and BP much improved and patient symptoms symptoms are improved and  left-sided weakness largely resolved.CT showed age indeterminate right PICA infarct as well as remote left PICA infarct and remote bilateral thalamic lacunar infarcts.  CTA head and neck no LVO, but atherosclerosis bilateral ICA bulb.  Left ICA terminus aneurysm 4 mm.  MRI scan of the brain showed remote age inferior cerebellar infarct bilaterally left larger than right.  2D echo showed EF of 55 to 60%.  LDL cholesterol was 66 mg percent.  Hemoglobin A1c was 5.2.  Patient has loop recorder inserted and so far paroxysmal A-fib was not epileptiform.  She was started on aspirin  and Plavix  for 3 weeks followed by aspirin  alone.  Patient states she has done well and has no further episodes of dizziness bradycardia or hypotension.  She denies any known prior history of strokes or TIAs.  She remains on aspirin  tolerating well without bruising or bleeding.  Her blood pressure is well-controlled on Norvasc  and Hyzaar  and today it is 113/73.  She admits to mild short-term memory difficulties but these are longstanding and nonprogressive.  She lives alone and is independent in all actives of daily living though her son who lives nearby keeps a close eye on her finances.  There is no family history of Alzheimer's or dementia  ROS:   14 system review of systems is positive for dizziness, memory difficulties, balance difficulties all other systems negative  PMH:  Past Medical History:  Diagnosis Date   Atrophic vaginitis    Dyspareunia  Fibroid    Hematuria    negative workup   Hypercholesteremia    Hypertension    Kidney stones    Migraines    MVP (mitral valve prolapse)    Osteopenia of femoral neck 06/2015   left hip - 2.3.  on fosamax    Social History:  Social History   Socioeconomic History   Marital status: Widowed    Spouse name: Not on file   Number of children: Not on file   Years of education: Not on file   Highest education level: Not on file  Occupational History   Not on file   Tobacco Use   Smoking status: Never   Smokeless tobacco: Never  Substance and Sexual Activity   Alcohol use: No   Drug use: No   Sexual activity: Not Currently    Partners: Male    Birth control/protection: Surgical    Comment: TAH  Other Topics Concern   Not on file  Social History Narrative   Husband passed 09/2016 from heart disease.   Social Drivers of Health   Tobacco Use: Low Risk (12/26/2024)   Patient History    Smoking Tobacco Use: Never    Smokeless Tobacco Use: Never    Passive Exposure: Not on file  Financial Resource Strain: Low Risk (10/18/2024)   Received from [facility]   Overall Financial Resource Strain (CARDIA)    How hard is it for you to pay for the very basics like food, housing, medical care, and heating?: Not hard at all  Food Insecurity: No Food Insecurity (11/12/2024)   Epic    Worried About Programme Researcher, Broadcasting/film/video in the Last Year: Never true    Ran Out of Food in the Last Year: Never true  Transportation Needs: No Transportation Needs (11/12/2024)   Epic    Lack of Transportation (Medical): No    Lack of Transportation (Non-Medical): No  Physical Activity: Inactive (10/18/2024)   Received from [facility]   Exercise Vital Sign    On average, how many days per week do you engage in moderate to strenuous exercise (like a brisk walk)?: 0 days    Minutes of Exercise per Session: Not on file  Stress: No Stress Concern Present (10/18/2024)   Received from [facility] - Occupational Stress Questionnaire    Do you feel stress - tense, restless, nervous, or anxious, or unable to sleep at night because your mind is troubled all the time - these days?: Not at all  Social Connections: Moderately Integrated (11/12/2024)   Social Connection and Isolation Panel    Frequency of Communication with Friends and Family: More than three times a week    Frequency of Social Gatherings with Friends and Family: More than  three times a week    Attends Religious Services: More than 4 times per year    Active Member of [employer] or Organizations: Yes    Attends Banker Meetings: 1 to 4 times per year    Marital Status: Widowed  Intimate Partner Violence: Not At Risk (11/12/2024)   Epic    Fear of Current or Ex-Partner: No    Emotionally Abused: No    Physically Abused: No    Sexually Abused: No  Depression (PHQ2-9): Not on file  Alcohol Screen: Not on file  Housing: Low Risk (11/12/2024)   Epic    Unable to Pay for Housing in the Last Year: No    Number  of Times Moved in the Last Year: 0    Homeless in the Last Year: No  Utilities: Not At Risk (11/12/2024)   Epic    Threatened with loss of utilities: No  Recent Concern: Utilities - At Risk (10/18/2024)   Received from [facility]    In the past 12 months has the electric, gas, oil, or water company threatened to shut off services in your home?: Yes  Health Literacy: Not on file    Medications:  Medications Ordered Prior to Encounter[1]  Allergies:  Allergies[2]  Physical Exam General: well developed, well nourished pleasant elderly Caucasian lady, seated, in no evident distress Head: head normocephalic and atraumatic.   Neck: supple with no carotid or supraclavicular bruits Cardiovascular: regular rate and rhythm, no murmurs Musculoskeletal: no deformity Skin:  no rash/petichiae Vascular:  Normal pulses all extremities  Neurologic Exam Mental Status: Awake and fully alert. Oriented to place and time. Recent and remote memory intact. Attention span, concentration and fund of knowledge appropriate. Mood and affect appropriate.  Recall 3/3.  Able to name 8 animals that can walk on  4 legs.  Clock drawing 4/4.  Geriatric depression scale not depressed.  Functional activity questionnaire 2-quite independent .MMSE 25/30  cranial Nerves: Fundoscopic exam reveals sharp disc margins. Pupils equal, briskly reactive to light.  Extraocular movements full without nystagmus. Visual fields full to confrontation. Hearing intact. Facial sensation intact. Face, tongue, palate moves normally and symmetrically.  Motor: Normal bulk and tone. Normal strength in all tested extremity muscles. Sensory.: intact to touch , pinprick , position and vibratory sensation.  Coordination: Rapid alternating movements normal in all extremities. Finger-to-nose and heel-to-shin performed accurately bilaterally. Gait and Station: Arises from chair without difficulty. Stance is normal. Gait demonstrates normal stride length and balance . Able to heel, toe and tandem walk with difficulty.  Reflexes: 1+ and symmetric. Toes downgoing.   NIHSS  0 Modified Rankin  0    12/26/2024    9:13 AM  MMSE - Mini Mental State Exam  Orientation to time 4  Orientation to Place 5  Registration 3  Attention/ Calculation 3  Recall 1  Language- name 2 objects 2  Language- repeat 1  Language- follow 3 step command 3  Language- read & follow direction 1  Write a sentence 1  Copy design 1  Total score 25      ASSESSMENT: 79 year old Caucasian lady with silent cerebellar infarcts of cryptogenic etiology.  Recent admission for dizziness in the setting of bradycardia and hypotension which appears to have improved.  She also has mild age-related cognitive impairment     PLAN:I had a long d/w patient and son about was silent cerebellar stroke, mild cognitive impairment, stroke, risk for recurrent stroke/TIAs, personally independently reviewed imaging studies and stroke evaluation results and answered questions.Continue aspirin  81 mg daily  for secondary stroke prevention and maintain strict control of hypertension with blood pressure goal below 130/90, diabetes with hemoglobin A1c goal below 6.5% and lipids with LDL cholesterol goal below 70 mg/dL. I also advised the patient to eat a healthy diet with plenty of whole grains, cereals, fruits and vegetables, exercise  regularly and maintain ideal body weight .I encouraged her to increase participation in cognitively challenging activities like solving crossword puzzles, playing bridge and sudoku.  We also discussed memory compensation strategies.  Followup in the future with me in 8 months or call earlier if necessary.   I personally spent a total of 50 minutes  in the care of the patient today including getting/reviewing separately obtained history, performing a medically appropriate exam/evaluation, counseling and educating, placing orders, referring and communicating with other health care professionals, documenting clinical information in the EHR, independently interpreting results, and coordinating care.       Eather Popp, MD  Note: This document was prepared with digital dictation and possible smart phrase technology. Any transcriptional errors that result from this process are unintentional.      [1]  Current Outpatient Medications on File Prior to Visit  Medication Sig Dispense Refill   amLODipine  (NORVASC ) 10 MG tablet Take 1 tablet (10 mg total) by mouth daily. 30 tablet 6   aspirin  EC 81 MG tablet Take 1 tablet (81 mg total) by mouth daily. Swallow whole. 30 tablet 12   Cholecalciferol (VITAMIN D PO) Take 1,000 Units by mouth daily.     citalopram  (CELEXA ) 20 MG tablet Take 20 mg by mouth daily.     losartan -hydrochlorothiazide  (HYZAAR ) 100-25 MG tablet Take 1 tablet by mouth daily. 30 tablet 11   potassium chloride  (KLOR-CON ) 10 MEQ tablet Take 10 mEq by mouth daily.     raloxifene (EVISTA) 60 MG tablet Take 60 mg by mouth daily.     SYNTHROID  100 MCG tablet Take 100 mcg by mouth daily.     acetaminophen  (TYLENOL ) 325 MG tablet Take 2 tablets (650 mg total) by mouth every 4 (four) hours as needed for headache or mild pain (pain score 1-3). (Patient not taking: Reported on 12/22/2024)     atorvastatin  (LIPITOR) 40 MG tablet Take 1 tablet (40 mg total) by mouth daily. (Patient not taking: Reported  on 12/26/2024) 90 tablet 3   No current facility-administered medications on file prior to visit.  [2]  Allergies Allergen Reactions   Bactrim  Ds [Sulfamethoxazole -Trimethoprim ] Nausea Only and Swelling   Codeine Nausea And Vomiting   Xanax [Alprazolam]     headache   "

## 2024-12-26 NOTE — Patient Instructions (Signed)
 I had a long d/w patient and son about was silent cerebellar stroke, mild cognitive impairment, stroke, risk for recurrent stroke/TIAs, personally independently reviewed imaging studies and stroke evaluation results and answered questions.Continue aspirin  81 mg daily  for secondary stroke prevention and maintain strict control of hypertension with blood pressure goal below 130/90, diabetes with hemoglobin A1c goal below 6.5% and lipids with LDL cholesterol goal below 70 mg/dL. I also advised the patient to eat a healthy diet with plenty of whole grains, cereals, fruits and vegetables, exercise regularly and maintain ideal body weight .I encouraged her to increase participation in cognitively challenging activities like solving crossword puzzles, playing bridge and sudoku.  We also discussed memory compensation strategies.  Followup in the future with me in 8 months or call earlier if necessary.  Stroke Prevention Some medical conditions and behaviors can lead to a higher chance of having a stroke. You can help prevent a stroke by eating healthy, exercising, not smoking, and managing any medical conditions you have. Stroke is a leading cause of functional impairment. Primary prevention is particularly important because a majority of strokes are first-time events. Stroke changes the lives of not only those who experience a stroke but also their family and other caregivers. How can this condition affect me? A stroke is a medical emergency and should be treated right away. A stroke can lead to brain damage and can sometimes be life-threatening. If a person gets medical treatment right away, there is a better chance of surviving and recovering from a stroke. What can increase my risk? The following medical conditions may increase your risk of a stroke: Cardiovascular disease. High blood pressure (hypertension). Diabetes. High cholesterol. Sickle cell disease. Blood clotting disorders (hypercoagulable  state). Obesity. Sleep disorders (obstructive sleep apnea). Other risk factors include: Being older than age 53. Having a history of blood clots, stroke, or mini-stroke (transient ischemic attack, TIA). Genetic factors, such as race, ethnicity, or a family history of stroke. Smoking cigarettes or using other tobacco products. Taking birth control pills, especially if you also use tobacco. Heavy use of alcohol or drugs, especially cocaine and methamphetamine. Physical inactivity. What actions can I take to prevent this? Manage your health conditions High cholesterol levels. Eating a healthy diet is important for preventing high cholesterol. If cholesterol cannot be managed through diet alone, you may need to take medicines. Take any prescribed medicines to control your cholesterol as told by your health care provider. Hypertension. To reduce your risk of stroke, try to keep your blood pressure below 130/80. Eating a healthy diet and exercising regularly are important for controlling blood pressure. If these steps are not enough to manage your blood pressure, you may need to take medicines. Take any prescribed medicines to control hypertension as told by your health care provider. Ask your health care provider if you should monitor your blood pressure at home. Have your blood pressure checked every year, even if your blood pressure is normal. Blood pressure increases with age and some medical conditions. Diabetes. Eating a healthy diet and exercising regularly are important parts of managing your blood sugar (glucose). If your blood sugar cannot be managed through diet and exercise, you may need to take medicines. Take any prescribed medicines to control your diabetes as told by your health care provider. Get evaluated for obstructive sleep apnea. Talk to your health care provider about getting a sleep evaluation if you snore a lot or have excessive sleepiness. Make sure that any other  medical conditions you  have, such as atrial fibrillation or atherosclerosis, are managed. Nutrition Follow instructions from your health care provider about what to eat or drink to help manage your health condition. These instructions may include: Reducing your daily calorie intake. Limiting how much salt (sodium) you use to 1,500 milligrams (mg) each day. Using only healthy fats for cooking, such as olive oil, canola oil, or sunflower oil. Eating healthy foods. You can do this by: Choosing foods that are high in fiber, such as whole grains, and fresh fruits and vegetables. Eating at least 5 servings of fruits and vegetables a day. Try to fill one-half of your plate with fruits and vegetables at each meal. Choosing lean protein foods, such as lean cuts of meat, poultry without skin, fish, tofu, beans, and nuts. Eating low-fat dairy products. Avoiding foods that are high in sodium. This can help lower blood pressure. Avoiding foods that have saturated fat, trans fat, and cholesterol. This can help prevent high cholesterol. Avoiding processed and prepared foods. Counting your daily carbohydrate intake.  Lifestyle If you drink alcohol: Limit how much you have to: 0-1 drink a day for women who are not pregnant. 0-2 drinks a day for men. Know how much alcohol is in your drink. In the [place] one drink equals one 12 oz bottle of beer ( ), one 5 oz glass of wine ( ), or one 1 oz glass of hard liquor (44mL). Do not use any products that contain nicotine or tobacco. These products include cigarettes, chewing tobacco, and vaping devices, such as e-cigarettes. If you need help quitting, ask your health care provider. Avoid secondhand smoke. Do not use drugs. Activity  Try to stay at a healthy weight. Get at least 30 minutes of exercise on most days, such as: Fast walking. Biking. Swimming. Medicines Take over-the-counter and prescription medicines only as told by your health care  provider. Aspirin  or blood thinners (antiplatelets or anticoagulants) may be recommended to reduce your risk of forming blood clots that can lead to stroke. Avoid taking birth control pills. Talk to your health care provider about the risks of taking birth control pills if: You are over 73 years old. You smoke. You get very bad headaches. You have had a blood clot. Where to find more information American Stroke Association: www.strokeassociation.org Get help right away if: You or a loved one has any symptoms of a stroke. BE FAST is an easy way to remember the main warning signs of a stroke: B - Balance. Signs are dizziness, sudden trouble walking, or loss of balance. E - Eyes. Signs are trouble seeing or a sudden change in vision. F - Face. Signs are sudden weakness or numbness of the face, or the face or eyelid drooping on one side. A - Arms. Signs are weakness or numbness in an arm. This happens suddenly and usually on one side of the body. S - Speech. Signs are sudden trouble speaking, slurred speech, or trouble understanding what people say. T - Time. Time to call emergency services. Write down what time symptoms started. You or a loved one has other signs of a stroke, such as: A sudden, severe headache with no known cause. Nausea or vomiting. Seizure. These symptoms may represent a serious problem that is an emergency. Do not wait to see if the symptoms will go away. Get medical help right away. Call your local emergency services (911 in the [place] Do not drive yourself to the hospital. Summary You can help to prevent a stroke by eating healthy,  exercising, not smoking, limiting alcohol intake, and managing any medical conditions you may have. Do not use any products that contain nicotine or tobacco. These include cigarettes, chewing tobacco, and vaping devices, such as e-cigarettes. If you need help quitting, ask your health care provider. Remember BE FAST for warning signs of a  stroke. Get help right away if you or a loved one has any of these signs. This information is not intended to replace advice given to you by your health care provider. Make sure you discuss any questions you have with your health care provider. Document Revised: 11/09/2022 Document Reviewed: 11/09/2022 Elsevier Patient Education  2024 Elsevier Inc.  Memory Compensation Strategies  Use WARM strategy.  W= write it down  A= associate it  R= repeat it  M= make a mental note  2.   You can keep a Glass Blower/designer.  Use a 3-ring notebook with sections for the following: calendar, important names and phone numbers,  medications, doctors' names/phone numbers, lists/reminders, and a section to journal what you did  each day.   3.    Use a calendar to write appointments down.  4.    Write yourself a schedule for the day.  This can be placed on the calendar or in a separate section of the Memory Notebook.  Keeping a  regular schedule can help memory.  5.    Use medication organizer with sections for each day or morning/evening pills.  You may need help loading it  6.    Keep a basket, or pegboard by the door.  Place items that you need to take out with you in the basket or on the pegboard.  You may also want to  include a message board for reminders.  7.    Use sticky notes.  Place sticky notes with reminders in a place where the task is performed.  For example:  turn off the  stove placed by the stove, lock the door placed on the door at eye level,  take your medications on  the bathroom mirror or by the place where you normally take your medications.  8.    Use alarms/timers.  Use while cooking to remind yourself to check on food or as a reminder to take your medicine, or as a  reminder to make a call, or as a reminder to perform another task, etc.

## 2025-01-02 ENCOUNTER — Ambulatory Visit: Attending: Physician Assistant | Admitting: Physical Therapy

## 2025-01-02 ENCOUNTER — Encounter: Payer: Self-pay | Admitting: Physical Therapy

## 2025-01-02 VITALS — BP 120/63 | HR 82

## 2025-01-02 DIAGNOSIS — R2681 Unsteadiness on feet: Secondary | ICD-10-CM | POA: Diagnosis present

## 2025-01-02 NOTE — Therapy (Signed)
 " OUTPATIENT PHYSICAL THERAPY NEURO EVALUATION   Patient Name: Rose Garza MRN: [id number] DOB:[birth year], 79 y.o., female Today's Date: 01/02/2025   PCP: Rose Camie Pepper, PA-C REFERRING PROVIDER: Jacques Camie Pepper, PA-C  END OF SESSION:  PT End of Session - 01/02/25 1405     Visit Number 1    Number of Visits 5    Date for Recertification  02/01/25    Authorization Type [employer]ICARE    PT Start Time 1401    PT Stop Time 1443    PT Time Calculation (min) 42 min    Equipment Utilized During Treatment Gait belt    Activity Tolerance Patient tolerated treatment well    Behavior During Therapy WFL for tasks assessed/performed          Past Medical History:  Diagnosis Date   Atrophic vaginitis    Dyspareunia    Fibroid    Hematuria    negative workup   Hypercholesteremia    Hypertension    Kidney stones    Migraines    MVP (mitral valve prolapse)    Osteopenia of femoral neck 06/2015   left hip - 2.3.  on fosamax   Past Surgical History:  Procedure Laterality Date   ABDOMINAL HYSTERECTOMY  04/1984    secondary to fibroids.  Ovaries remain   ANKLE SURGERY Left 2010   CESAREAN SECTION     times 2   DILATION AND CURETTAGE OF UTERUS     HEMORRHOID BANDING     LOOP RECORDER INSERTION N/A 11/15/2024   Procedure: LOOP RECORDER INSERTION;  Surgeon: Almetta Donnice LABOR, MD;  Location: [facility] INVASIVE CV LAB;  Service: Cardiovascular;  Laterality: N/A;   TEMPOROMANDIBULAR JOINT SURGERY  1985   Patient Active Problem List   Diagnosis Date Noted   Complete heart block (HCC) 11/12/2024   Chronic kidney disease, stage 3a (HCC) 10/18/2024   Mild mitral regurgitation 01/06/2024   Bilateral sensorineural hearing loss 10/13/2023   History of adenomatous polyp of colon 04/16/2021   Impacted cerumen of right ear 11/25/2020   Bilateral hearing loss 07/02/2020   ETD (Eustachian tube dysfunction), bilateral 07/02/2020   Globus pharyngeus 07/02/2020    Rhinitis, chronic 07/02/2020   Hemochromatosis associated with mutation in HFE gene 03/15/2017   Hyperlipidemia 03/15/2017   Hypertension 03/15/2017   Hypothyroid 03/15/2017   Osteoporosis 03/15/2017    ONSET DATE: 11/21/2024  REFERRING DIAG: I72.9 (ICD-10-CM) - Aneurysm of unspecified site R29.898 (ICD-10-CM) - Other symptoms and signs involving the musculoskeletal system  THERAPY DIAG:  Unsteadiness on feet  Rationale for Evaluation and Treatment: Rehabilitation  SUBJECTIVE:  SUBJECTIVE STATEMENT: Feeling fine, unsure of why she is here. Does not have any difficulties with walking or balance. Pt's daughter in law notes that she shuffles her feet more when she is walking after an ankle injury over 10 years ago. Had PT pretty recently for this. Was [facility] for a full year. Thinks they were trying to get her to pick her feet up more. Was not given any exercises to work on at home. Feels the same after getting over being in the hospital. Has been walking in the neighborhood for exercise and goes up and down steps. Does not use a cane, but has one and does not feel the need for one.  Pt accompanied by: pt's daughter in law [name]   PERTINENT HISTORY: PMH: Migraines, HTN, CKD, hx of CVA  Presented 11/23 with dizziness on and off for 4 weeks found to have left upper extremity weakness in ER during cardiology evaluation of hypotension and bradycardia with complete heart block. Likely recrudescence of late subacute/chronic stroke symptoms in the setting of hypotension and bradycardia Chronic / late subacute stroke. Loop recorder placed 11/26.   Pt had loop recorder implanted to monitor for a fib  PAIN:  Are you having pain? No  Vitals:   01/02/25 1418  BP: 120/63  Pulse: 82      PRECAUTIONS: Fall   FALLS: Has patient fallen in last 6 months? No  LIVING ENVIRONMENT: Lives with: lives alone, has family that live nearby  Lives in: House/apartment Stairs: Yes: External: 1 steps; none, has an upstairs but does not go up there Has following equipment at home: Single point cane, Walker - 2 wheeled, and shower chair  PLOF: Independent Still driving   PATIENT GOALS: Wants to work on more strength/balance and avoid evil falls (per daughter in law)  OBJECTIVE:  Note: Objective measures were completed at Evaluation unless otherwise noted.  DIAGNOSTIC FINDINGS: MRI brain 11/12/24: IMPRESSION: 1. No acute intracranial abnormality. 2. Advanced confluent periventricular and subcortical T2 hyperintensities bilaterally extending into the brainstem and involving the basal ganglia. This most likely reflects the sequelae of chronic microvascular ischemia. 3. Remote inferior cerebellar infarcts bilaterally, left more prominent than right.  CT angio head/neck 11/12/24: IMPRESSION: 1. No acute large vessel occlusion. 2. Perfusion study is nondiagnostic for potential ischemia due to significant patient motion artifact. No core infarct is suggested. 3. Atherosclerotic calcifications within the carotid bifurcations and proximal left ICA bilaterally without significant stenosis. 4. 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  COGNITION: Overall cognitive status: Within functional limits for tasks assessed Per daughter in law, reports changes in short term memory in the past few months SENSATION: WFL Light touch: WFL  COORDINATION: Heel to shin: WNL    POSTURE: rounded shoulders  LOWER EXTREMITY MMT:    MMT Right Eval Left Eval  Hip flexion 5 5  Hip extension    Hip abduction    Hip adduction    Hip internal rotation    Hip external rotation    Knee flexion 5 5  Knee extension 5 5  Ankle dorsiflexion 5 5   Ankle plantarflexion    Ankle inversion    Ankle eversion    (Blank rows = not tested)  BED MOBILITY:  Pt denies difficulties   TRANSFERS: Sit to stand: Complete Independence  Assistive device utilized: None     Stand to sit: Complete Independence  Assistive device utilized: None  STAIRS: Findings: Level of Assistance: Modified independence, Stair Negotiation Technique: Alternating Pattern  with Single Rail on Right, Number of Stairs: 4, Height of Stairs: 6   , and Comments: unable to perform without handrail  GAIT: Findings: Gait Characteristics: step through pattern, decreased step length- Right, decreased step length- Left, and decreased stride length, Distance walked: Clinic distances, Assistive device utilized:None, and Level of assistance: Complete Independence  FUNCTIONAL TESTS:  5 times sit to stand: 7.8 seconds with no UE support  30 seconds chair stand test: 16 sit <> stands with no UE support (above average for age) 10 meter walk test: 11.4 seconds = 2.88 ft/sec SLS: RLE ~1 second, LLE: ~2 seconds    M-CTSIB  Condition 1: Firm Surface, EO 30 Sec, Normal Sway  Condition 2: Firm Surface, EC 30 Sec, Normal Sway  Condition 3: Foam Surface, EO 30 Sec, Normal Sway  Condition 4: Foam Surface, EC 16 Sec     [facility] PT Assessment - 01/02/25 1427       Functional Gait  Assessment   Gait assessed  Yes    Gait Level Surface Walks 20 ft in less than 7 sec but greater than 5.5 sec, uses assistive device, slower speed, mild gait deviations, or deviates 6-10 in outside of the 12 in walkway width.   6.8   Change in Gait Speed Able to smoothly change walking speed without loss of balance or gait deviation. Deviate no more than 6 in outside of the 12 in walkway width.    Gait with Horizontal Head Turns Performs head turns smoothly with no change in gait. Deviates no more than 6 in outside 12 in walkway width    Gait with Vertical Head Turns Performs head turns with no change in  gait. Deviates no more than 6 in outside 12 in walkway width.    Gait and Pivot Turn Pivot turns safely within 3 sec and stops quickly with no loss of balance.    Step Over Obstacle Is able to step over 2 stacked shoe boxes taped together (9 in total height) without changing gait speed. No evidence of imbalance.    Gait with Narrow Base of Support Ambulates less than 4 steps heel to toe or cannot perform without assistance.    Gait with Eyes Closed Cannot walk 20 ft without assistance, severe gait deviations or imbalance, deviates greater than 15 in outside 12 in walkway width or will not attempt task.   veers to R, unable to perform entire distance   Ambulating Backwards Walks 20 ft, uses assistive device, slower speed, mild gait deviations, deviates 6-10 in outside 12 in walkway width.   15.4 seconds   Steps Alternating feet, must use rail.    Total Score 21    FGA comment: 21/30 = Medium Fall Risk  TREATMENT DATE: 01/02/25   Self-Care: Educated on clinical findings, POC, fall risk based on outcome measures   PATIENT EDUCATION: Education details: See above  Person educated: Patient and Daughter in Nature Conservation Officer method: Explanation Education comprehension: verbalized understanding  HOME EXERCISE PROGRAM: Will provide at future session   GOALS: Goals reviewed with patient? Yes  SHORT TERM GOALS: ALL STGS = LTGS   LONG TERM GOALS: Target date: 01/30/2025  Pt will be independent with final HEP for strength, gait, balance in order to build upon functional gains made in therapy. Baseline:  Goal status: INITIAL  2.  Pt will improve FGA to at least a 24/30 in order to demo decr fall risk.  Baseline: 21/30 Goal status: INITIAL  3.  Pt will improve condition 4 of mCTSIB to at least 25 seconds in order to demo improved vestibular input for balance.   Baseline: 16 seconds  Goal status: INITIAL    ASSESSMENT:  CLINICAL IMPRESSION: Patient is a 79 year old female referred to Neuro [facility] for hx of CVAs/aneurysm.   Pt's PMH is significant for: Migraines, HTN, CKD, hx of CVA. The following deficits were present during the exam: impaired balance (tandem, SLS, EC), and gait abnormalities (decr stride length). Based on FGA, pt is a moderate risk for falls. Pt only able to hold condition 4 of mCTSIB for 16 seconds indicating decr vestibular input for balance and pt also challenged by SLS. Pt would benefit from skilled PT to address these impairments and functional limitations to maximize functional mobility independence and decr fall risk.    OBJECTIVE IMPAIRMENTS: decreased activity tolerance and decreased balance.   ACTIVITY LIMITATIONS: stairs and locomotion level  PARTICIPATION LIMITATIONS: community activity  PERSONAL FACTORS: Age, Behavior pattern, Past/current experiences, Time since onset of injury/illness/exacerbation, and 3+ comorbidities: Migraines, HTN, CKD, hx of CVA are also affecting patient's functional outcome.   REHAB POTENTIAL: Good  CLINICAL DECISION MAKING: Stable/uncomplicated  EVALUATION COMPLEXITY: Low  PLAN:  PT FREQUENCY: 1x/week  PT DURATION: 4 weeks  PLANNED INTERVENTIONS: [id number]- PT Re-evaluation, 97110-Therapeutic exercises, 97530- Therapeutic activity, 97112- Neuromuscular re-education, 97535- Self Care, [id number]- Manual therapy, Patient/Family education, Balance training, Stair training, and Vestibular training  PLAN FOR NEXT SESSION: initiate HEP for balance with EC on foam, tandem, unlevel surfaces, SLS. Work on functional strength/high level balance    [place] LOISE Senate, PT, DPT 01/02/2025, 4:39 PM        "

## 2025-01-04 ENCOUNTER — Encounter: Payer: Self-pay | Admitting: Neuroradiology

## 2025-01-04 ENCOUNTER — Ambulatory Visit: Admitting: Neuroradiology

## 2025-01-04 VITALS — BP 79/53 | HR 90 | Temp 97.6°F | Ht 62.0 in | Wt 114.8 lb

## 2025-01-04 DIAGNOSIS — I671 Cerebral aneurysm, nonruptured: Secondary | ICD-10-CM | POA: Diagnosis not present

## 2025-01-04 NOTE — Progress Notes (Signed)
 "    Chief Complaint: Patient was seen in consultation today for  Chief Complaint  Patient presents with   New Patient (Initial Visit)     Referral: I67.1 (ICD-10-CM) - Cerebral aneurysm    at the request of Xu,Jindong  Referring Physician(s): Xu,Jindong  Leatrice G Bleicher is a 79 y.o. female seen for evaluation of an incidentally detected brain aneurysm.  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 Estimated 1% annual rupture risk. Surgical intervention carries 3% complication risk. Intervention optional due to advanced age of the patient, onset of memory loss, and favorable natural history.  I discussed the options of surgery versus observation with the patient and her son, and they prefer observation. - Scheduled follow-up CT scan in one year to monitor aneurysm size.      Discussed the use of AI scribe software for clinical note transcription with the patient, who gave verbal consent to proceed.  History of Present Illness Rose Garza is an 79 year old female who presents for evaluation of a carotid artery terminus aneurysm.  In late November she experienced an episode of generalized weakness at church, and was taken to the hospital by ambulance.    During the hospital evaluation for the stroke, an incidental finding of a 4mm aneurysm in a carotid artery terminus was discovered. She has a history of high blood pressure, hyperlipidemia, and hypothyroid disorder, for which she takes medication.  She takes 81 mg of aspirin  daily.  Socially, she lives alone in [place], managing her daily activities independently, including cooking and driving, but hires help for cleaning and yard work. She has some short-term memory difficulties but manages her own bills.  No history of headache suspicious for brain aneurysm.  No family history of brain aneurysms.    Past Medical History:  Diagnosis Date   Atrophic vaginitis     Dyspareunia    Fibroid    Hematuria    negative workup   Hypercholesteremia    Hypertension    Kidney stones    Migraines    MVP (mitral valve prolapse)    Osteopenia of femoral neck 06/2015   left hip - 2.3.  on fosamax    Past Surgical History:  Procedure Laterality Date   ABDOMINAL HYSTERECTOMY  04/1984    secondary to fibroids.  Ovaries remain   ANKLE SURGERY Left 2010   CESAREAN SECTION     times 2   DILATION AND CURETTAGE OF UTERUS     HEMORRHOID BANDING     LOOP RECORDER INSERTION N/A 11/15/2024   Procedure: LOOP RECORDER INSERTION;  Surgeon: Almetta Donnice LABOR, MD;  Location: [facility] INVASIVE CV LAB;  Service: Cardiovascular;  Laterality: N/A;   TEMPOROMANDIBULAR JOINT SURGERY  1985    Allergies: Bactrim  ds [sulfamethoxazole -trimethoprim ], Codeine, and Xanax [alprazolam]  Medications: Prior to Admission medications  Medication Sig Start Date End Date Taking? Authorizing Provider  acetaminophen  (TYLENOL ) 325 MG tablet Take 2 tablets (650 mg total) by mouth every 4 (four) hours as needed for headache or mild pain (pain score 1-3). 11/15/24  Yes Lesia Ozell Barter, PA-C  amLODipine  (NORVASC ) 10 MG tablet Take 1 tablet (10 mg total) by mouth daily. 11/16/24  Yes Lesia Ozell Barter, PA-C  aspirin  EC 81 MG tablet Take 1 tablet (81 mg total) by mouth daily. Swallow whole. 11/16/24  Yes Lesia Ozell Barter, PA-C  atorvastatin  (LIPITOR) 40 MG tablet Take 1 tablet (40 mg total) by mouth daily. 11/16/24  Yes Lesia Ozell Barter, PA-C  Cholecalciferol (VITAMIN D PO) Take 1,000 Units by mouth daily.   Yes [provider]  citalopram  (CELEXA ) 20 MG tablet Take 20 mg by mouth daily.   Yes [provider]  losartan -hydrochlorothiazide  (HYZAAR ) 100-25 MG tablet Take 1 tablet by mouth daily. 11/15/24  Yes Lesia Ozell Barter, PA-C  potassium chloride  (KLOR-CON ) 10 MEQ tablet Take 10 mEq by mouth daily. 12/07/24  Yes [provider]   raloxifene (EVISTA) 60 MG tablet Take 60 mg by mouth daily. 07/02/15  Yes [provider]  SYNTHROID  100 MCG tablet Take 100 mcg by mouth daily. 07/18/15  Yes [provider]     Family History  Problem Relation Age of Onset   Breast cancer Mother 55   Heart disease Mother    Osteoporosis Mother    Breast cancer Maternal Aunt    Breast cancer Maternal Aunt    Breast cancer Maternal Aunt     Social History   Socioeconomic History   Marital status: Widowed    Spouse name: Not on file   Number of children: Not on file   Years of education: Not on file   Highest education level: Not on file  Occupational History   Not on file  Tobacco Use   Smoking status: Never   Smokeless tobacco: Never  Vaping Use   Vaping status: Never Used  Substance and Sexual Activity   Alcohol use: No   Drug use: No   Sexual activity: Not Currently    Partners: Male    Birth control/protection: Surgical    Comment: TAH  Other Topics Concern   Not on file  Social History Narrative   Husband passed 09/2016 from heart disease.   Social Drivers of Health   Tobacco Use: Low Risk (01/04/2025)   Patient History    Smoking Tobacco Use: Never    Smokeless Tobacco Use: Never    Passive Exposure: Not on file  Financial Resource Strain: Low Risk (10/18/2024)   Received from [facility]   Overall Financial Resource Strain (CARDIA)    How hard is it for you to pay for the very basics like food, housing, medical care, and heating?: Not hard at all  Food Insecurity: No Food Insecurity (11/12/2024)   Epic    Worried About Programme Researcher, Broadcasting/film/video in the Last Year: Never true    Ran Out of Food in the Last Year: Never true  Transportation Needs: No Transportation Needs (11/12/2024)   Epic    Lack of Transportation (Medical): No    Lack of Transportation (Non-Medical): No  Physical Activity: Inactive (10/18/2024)   Received from [facility]   Exercise Vital Sign    On average, how many  days per week do you engage in moderate to strenuous exercise (like a brisk walk)?: 0 days    Minutes of Exercise per Session: Not on file  Stress: No Stress Concern Present (10/18/2024)   Received from [facility] - Occupational Stress Questionnaire    Do you feel stress - tense, restless, nervous, or anxious, or unable to sleep at night because your mind is troubled all the time - these days?: Not at all  Social Connections: Moderately Integrated (11/12/2024)   Social Connection and Isolation Panel    Frequency of Communication with Friends and Family: More than three times a week    Frequency of Social Gatherings with Friends and Family: More than three times  a week    Attends Religious Services: More than 4 times per year    Active Member of Clubs or Organizations: Yes    Attends Banker Meetings: 1 to 4 times per year    Marital Status: Widowed  Depression (PHQ2-9): Not on file  Alcohol Screen: Not on file  Housing: Low Risk (11/12/2024)   Epic    Unable to Pay for Housing in the Last Year: No    Number of Times Moved in the Last Year: 0    Homeless in the Last Year: No  Utilities: Not At Risk (11/12/2024)   Epic    Threatened with loss of utilities: No  Recent Concern: Utilities - At Risk (10/18/2024)   Received from [facility]    In the past 12 months has the electric, gas, oil, or water company threatened to shut off services in your home?: Yes  Health Literacy: Not on file    Review of Systems  Respiratory:  Negative for shortness of breath.   Cardiovascular:  Negative for chest pain.    Vital Signs:  BP (!) 79/53   Pulse 90   Temp 97.6 F (36.4 C) (Oral)   Ht 5' 2 (1.575 m)   Wt 114 lb 12.8 oz (52.1 kg)   LMP 12/21/1986   SpO2 98%   BMI 21.00 kg/m   Physical Exam CHEST: Clear to auscultation bilaterally, no wheezes, rhonchi, or crackles. NEUROLOGICAL: Alert and oriented with normal speech  expression, fluency and comprehension. Visual fields are full to confrontation.   Face is symmetric. Strength in the arms and legs is symmetric with no drift.  Sensation is normal and symmetric. No ataxia. No inattention.   Imaging:  Results Radiology Brain imaging (11/2024): I reviewed the MRI and CTA from 11/12/2024.  The MRI shows extensive chronic white matter disease with old lacunar infarcts in the left cerebellum, no acute infarct.  There is a 4 mm left internal carotid artery terminus aneurysm.  No extracranial carotid artery stenosis.  Labs:  CBC: Recent Labs    11/12/24 1326 11/12/24 1335 11/13/24 0127  WBC 15.9*  --  11.2*  HGB 12.3 12.6 11.5*  HCT 37.8 37.0 34.9*  PLT 225  --  182    COAGS: No results for input(s): INR, APTT in the last 8760 hours.  BMP: Recent Labs    11/12/24 1326 11/12/24 1335 11/13/24 0127 11/14/24 0342 11/15/24 0327  NA 135 137 138 142 137  K 4.1 4.2 4.0 3.0* 3.4*  CL 106 109 110 108 107  CO2 16*  --  23 23 19*  GLUCOSE 139* 171* 81 71 82  BUN 26* 26* 21 12 14   CALCIUM  8.9  --  8.2* 8.0* 8.0*  CREATININE 1.51* 1.40* 1.07* 0.86 0.92  GFRNONAA 35*  --  53* >60 >60    LIVER FUNCTION TESTS: Recent Labs    11/12/24 1326  BILITOT 0.6  AST 25  ALT 9  ALKPHOS 48  PROT 5.7*  ALBUMIN 3.0*      Electronically Signed: Nancyann LULLA Burns  01/04/2025, 1:36 PM   "

## 2025-01-04 NOTE — Addendum Note (Signed)
 Addended by: Magalie Almon on: 01/04/2025 02:54 PM   Modules accepted: Orders

## 2025-01-09 ENCOUNTER — Ambulatory Visit: Admitting: Physical Therapy

## 2025-01-12 ENCOUNTER — Encounter: Payer: Self-pay | Admitting: Physical Therapy

## 2025-01-12 ENCOUNTER — Ambulatory Visit: Admitting: Physical Therapy

## 2025-01-12 VITALS — BP 106/63 | HR 83

## 2025-01-12 DIAGNOSIS — R2681 Unsteadiness on feet: Secondary | ICD-10-CM

## 2025-01-12 NOTE — Therapy (Addendum)
 " OUTPATIENT PHYSICAL THERAPY NEURO TREATMENT   Patient Name: Rose Garza MRN: [id number] DOB:11-Sep-1946, 79 y.o., female Today's Date: 01/12/2025   PCP: Jacques Camie Pepper, PA-C REFERRING PROVIDER: Jacques Camie Pepper, PA-C  END OF SESSION:  PT End of Session - 01/12/25 0847     Visit Number 2    Number of Visits 5    Date for Recertification  02/01/25    Authorization Type BLUE CROSS BLUE [employer]IELD MEDICARE    PT Start Time [id number]    PT Stop Time 0932    PT Time Calculation (min) 43 min    Equipment Utilized During Treatment Gait belt    Activity Tolerance Patient tolerated treatment well    Behavior During Therapy WFL for tasks assessed/performed          Past Medical History:  Diagnosis Date   Atrophic vaginitis    Dyspareunia    Fibroid    Hematuria    negative workup   Hypercholesteremia    Hypertension    Kidney stones    Migraines    MVP (mitral valve prolapse)    Osteopenia of femoral neck 06/2015   left hip - 2.3.  on fosamax   Past Surgical History:  Procedure Laterality Date   ABDOMINAL HYSTERECTOMY  04/1984    secondary to fibroids.  Ovaries remain   ANKLE SURGERY Left 2010   CESAREAN SECTION     times 2   DILATION AND CURETTAGE OF UTERUS     HEMORRHOID BANDING     LOOP RECORDER INSERTION N/A 11/15/2024   Procedure: LOOP RECORDER INSERTION;  Surgeon: Almetta Donnice LABOR, MD;  Location: [facility] INVASIVE CV LAB;  Service: Cardiovascular;  Laterality: N/A;   TEMPOROMANDIBULAR JOINT SURGERY  1985   Patient Active Problem List   Diagnosis Date Noted   Complete heart block (HCC) 11/12/2024   Chronic kidney disease, stage 3a (HCC) 10/18/2024   Mild mitral regurgitation 01/06/2024   Bilateral sensorineural hearing loss 10/13/2023   History of adenomatous polyp of colon 04/16/2021   Impacted cerumen of right ear 11/25/2020   Bilateral hearing loss 07/02/2020   ETD (Eustachian tube dysfunction), bilateral 07/02/2020   Globus pharyngeus 07/02/2020    Rhinitis, chronic 07/02/2020   Hemochromatosis associated with mutation in HFE gene 03/15/2017   Hyperlipidemia 03/15/2017   Hypertension 03/15/2017   Hypothyroid 03/15/2017   Osteoporosis 03/15/2017    ONSET DATE: 11/21/2024  REFERRING DIAG: I72.9 (ICD-10-CM) - Aneurysm of unspecified site R29.898 (ICD-10-CM) - Other symptoms and signs involving the musculoskeletal system  THERAPY DIAG:  Unsteadiness on feet  Rationale for Evaluation and Treatment: Rehabilitation  SUBJECTIVE:  SUBJECTIVE STATEMENT: Pt reports no changes since last visit. Denies falls or pain.   Pt accompanied by: pt's son jeff  PERTINENT HISTORY: PMH: Migraines, HTN, CKD, hx of CVA  Presented 11/23 with dizziness on and off for 4 weeks found to have left upper extremity weakness in ER during cardiology evaluation of hypotension and bradycardia with complete heart block. Likely recrudescence of late subacute/chronic stroke symptoms in the setting of hypotension and bradycardia Chronic / late subacute stroke. Loop recorder placed 11/26.   Pt had loop recorder implanted to monitor for a fib  PAIN:  Are you having pain? No  Vitals:   01/12/25 0854  BP: 106/63  Pulse: 83      PRECAUTIONS: Fall   FALLS: Has patient fallen in last 6 months? No  LIVING ENVIRONMENT: Lives with: lives alone, has family that live nearby  Lives in: House/apartment Stairs: Yes: External: 1 steps; none, has an upstairs but does not go up there Has following equipment at home: Single point cane, Walker - 2 wheeled, and shower chair  PLOF: Independent Still driving   PATIENT GOALS: Wants to work on more strength/balance and avoid evil falls (per daughter in law)  OBJECTIVE:  Note: Objective measures were completed at Evaluation unless  otherwise noted.  DIAGNOSTIC FINDINGS: MRI brain 11/12/24: IMPRESSION: 1. No acute intracranial abnormality. 2. Advanced confluent periventricular and subcortical T2 hyperintensities bilaterally extending into the brainstem and involving the basal ganglia. This most likely reflects the sequelae of chronic microvascular ischemia. 3. Remote inferior cerebellar infarcts bilaterally, left more prominent than right.  CT angio head/neck 11/12/24: IMPRESSION: 1. No acute large vessel occlusion. 2. Perfusion study is nondiagnostic for potential ischemia due to significant patient motion artifact. No core infarct is suggested. 3. Atherosclerotic calcifications within the carotid bifurcations and proximal left ICA bilaterally without significant stenosis. 4. 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  COGNITION: Overall cognitive status: Within functional limits for tasks assessed Per daughter in law, reports changes in short term memory in the past few months SENSATION: WFL Light touch: WFL  COORDINATION: Heel to shin: WNL    POSTURE: rounded shoulders  LOWER EXTREMITY MMT:    MMT Right Eval Left Eval  Hip flexion 5 5  Hip extension    Hip abduction    Hip adduction    Hip internal rotation    Hip external rotation    Knee flexion 5 5  Knee extension 5 5  Ankle dorsiflexion 5 5  Ankle plantarflexion    Ankle inversion    Ankle eversion    (Blank rows = not tested)  BED MOBILITY:  Pt denies difficulties   TRANSFERS: Sit to stand: Complete Independence  Assistive device utilized: None     Stand to sit: Complete Independence  Assistive device utilized: None       STAIRS: Findings: Level of Assistance: Modified independence, Stair Negotiation Technique: Alternating Pattern  with Single Rail on Right, Number of Stairs: 4, Height of Stairs: 6   , and Comments: unable to perform without handrail  GAIT: Findings: Gait  Characteristics: step through pattern, decreased step length- Right, decreased step length- Left, and decreased stride length, Distance walked: Clinic distances, Assistive device utilized:None, and Level of assistance: Complete Independence  FUNCTIONAL TESTS:  5 times sit to stand: 7.8 seconds with no UE support  30 seconds chair stand test: 16 sit <> stands with no UE support (above average for age) 10 meter walk test: 11.4 seconds =  2.88 ft/sec SLS: RLE ~1 second, LLE: ~2 seconds    M-CTSIB  Condition 1: Firm Surface, EO 30 Sec, Normal Sway  Condition 2: Firm Surface, EC 30 Sec, Normal Sway  Condition 3: Foam Surface, EO 30 Sec, Normal Sway  Condition 4: Foam Surface, EC 16 Sec                                                                                                                                   TREATMENT DATE: 01/12/25  NMR EC on foam in corner 30 second static balance, SBA, able to maintain balance without touching chair or wall, mild sway 2x10 head turns and nods, SBA, mod sway head turns > nods  Tandem walking at counter 3x down/back 25ft Requires single UE use frequently on counter, SBA, unable to walk in full tandem as she frequently steps out of narrow base  Step ups at stairs 10x each LE leading, SBA-CGA, no UE use 10x each LE leading with high knee, frequent single UE use on handrail during high knee/SLS, CGA, required repeated cueing to raise knee  SLS in // bars 2x30 sec static balance, CGA, frequent UE use on bars and unable to hold foot up for >5-10 seconds at a time 2x5 tapping 3 fwd gum drops while holding SLS, CGA with occasional use of UE on bars, pt reported it is easier to stand on 1 leg while doing the taps then just holding it  STS 10x from mat, no UE use, supervision, pt reported her legs were tired after  PATIENT EDUCATION: Education details: balance strategies and training, moving slow and controlled with balance exercises, HEP, POC Person  educated: Patient and son  Education method: Explanation, Demonstration, Verbal cues, and Handouts Education comprehension: verbalized understanding and returned demonstration  HOME EXERCISE PROGRAM: Access Code: [email or web address] URL: [email or web address] Date: 01/12/2025 Prepared by: Sheffield Senate  Exercises - Tandem Walking with Counter Support  - 1 x daily - 5 x weekly - 3 sets - 10 reps - Standing Single Leg Stance with Counter Support  - 1 x daily - 5 x weekly - 3 sets - 20 hold - Sit to Stand  - 1 x daily - 5 x weekly - 2 sets - 10 reps - Standing Balance with Eyes Closed on Foam  - 1 x daily - 5 x weekly - 3 sets - 30 hold   GOALS: Goals reviewed with patient? Yes  SHORT TERM GOALS: ALL STGS = LTGS   LONG TERM GOALS: Target date: 01/30/2025  Pt will be independent with final HEP for strength, gait, balance in order to build upon functional gains made in therapy. Baseline:  Goal status: INITIAL  2.  Pt will improve FGA to at least a 24/30 in order to demo decr fall risk.  Baseline: 21/30 Goal status: INITIAL  3.  Pt will improve condition 4 of mCTSIB to at least 25 seconds in order to  demo improved vestibular input for balance.  Baseline: 16 seconds  Goal status: INITIAL    ASSESSMENT:  CLINICAL IMPRESSION: Pt presented to PT today with no reported changes or falls since eval. Vitals were assessed at start of session and were WNL for pt and participation in PT. Todays treatment session was focused on balance training with EC, narrow base, and SLS tasks. Pt demonstrated the most difficulty with SLS tasks, requiring the most assistance and UE use as well as reporting the most hesitancy. Step ups and STS were performed for functional strengthening of the LEs to support single leg balance. HEP was reviewed with pt and her son to target balance training and functional strengthening. Pt will continue to benefit from skilled PT to address balance deficits and reduce  fall risk.   OBJECTIVE IMPAIRMENTS: decreased activity tolerance and decreased balance.   ACTIVITY LIMITATIONS: stairs and locomotion level  PARTICIPATION LIMITATIONS: community activity  PERSONAL FACTORS: Age, Behavior pattern, Past/current experiences, Time since onset of injury/illness/exacerbation, and 3+ comorbidities: Migraines, HTN, CKD, hx of CVA are also affecting patient's functional outcome.   REHAB POTENTIAL: Good  CLINICAL DECISION MAKING: Stable/uncomplicated  EVALUATION COMPLEXITY: Low  PLAN:  PT FREQUENCY: 1x/week  PT DURATION: 4 weeks  PLANNED INTERVENTIONS: [id number]- PT Re-evaluation, 97110-Therapeutic exercises, 97530- Therapeutic activity, 97112- Neuromuscular re-education, 97535- Self Care, [id number]- Manual therapy, Patient/Family education, Balance training, Stair training, and Vestibular training  PLAN FOR NEXT SESSION: initiate HEP for balance with EC on foam, tandem, unlevel surfaces, SLS. Work on functional strength/high level balance    Valery Blumenthal, Student-PT 01/12/2025, 11:23 AM        "

## 2025-01-16 ENCOUNTER — Ambulatory Visit: Attending: Student in an Organized Health Care Education/Training Program

## 2025-01-16 ENCOUNTER — Ambulatory Visit: Admitting: Physical Therapy

## 2025-01-16 DIAGNOSIS — I442 Atrioventricular block, complete: Secondary | ICD-10-CM

## 2025-01-16 LAB — CUP PACEART REMOTE DEVICE CHECK
Date Time Interrogation Session: 20260127054138
Implantable Pulse Generator Implant Date: 20251126
Pulse Gen Model: 5000
Pulse Gen Serial Number: 511095713

## 2025-01-21 ENCOUNTER — Ambulatory Visit: Payer: Self-pay | Admitting: Student in an Organized Health Care Education/Training Program

## 2025-01-23 ENCOUNTER — Ambulatory Visit: Admitting: Physical Therapy

## 2025-01-25 NOTE — Progress Notes (Signed)
 Remote Loop Recorder Transmission

## 2025-01-30 ENCOUNTER — Ambulatory Visit: Admitting: Physical Therapy

## 2025-02-16 ENCOUNTER — Ambulatory Visit

## 2025-03-19 ENCOUNTER — Ambulatory Visit

## 2025-03-26 ENCOUNTER — Ambulatory Visit: Admitting: Neurology

## 2025-04-19 ENCOUNTER — Ambulatory Visit

## 2025-05-20 ENCOUNTER — Ambulatory Visit

## 2025-06-20 ENCOUNTER — Ambulatory Visit

## 2025-07-21 ENCOUNTER — Ambulatory Visit

## 2025-08-21 ENCOUNTER — Ambulatory Visit

## 2025-09-21 ENCOUNTER — Ambulatory Visit

## 2025-11-26 ENCOUNTER — Ambulatory Visit: Admitting: Neurology
